# Patient Record
Sex: Female | Born: 1960 | Race: White | Hispanic: No | Marital: Married | Smoking: Never smoker
Health system: Southern US, Community
[De-identification: ages and names within clinical notes are randomized; demographics above are authoritative.]

## PROBLEM LIST (undated history)

## (undated) DIAGNOSIS — M1990 Unspecified osteoarthritis, unspecified site: Secondary | ICD-10-CM

## (undated) DIAGNOSIS — C801 Malignant (primary) neoplasm, unspecified: Secondary | ICD-10-CM

## (undated) DIAGNOSIS — R519 Headache, unspecified: Secondary | ICD-10-CM

## (undated) DIAGNOSIS — R42 Dizziness and giddiness: Secondary | ICD-10-CM

## (undated) HISTORY — PX: APPENDECTOMY: SHX54

## (undated) HISTORY — PX: KNEE ARTHROSCOPY: SUR90

---

## 2015-12-14 ENCOUNTER — Encounter (HOSPITAL_BASED_OUTPATIENT_CLINIC_OR_DEPARTMENT_OTHER): Payer: Self-pay | Admitting: *Deleted

## 2015-12-14 DIAGNOSIS — H81391 Other peripheral vertigo, right ear: Secondary | ICD-10-CM | POA: Insufficient documentation

## 2015-12-14 DIAGNOSIS — R42 Dizziness and giddiness: Secondary | ICD-10-CM | POA: Diagnosis present

## 2015-12-14 NOTE — ED Notes (Signed)
Pt reports right ear being stopped up today.  Reports that tonight while reading she got dizzy and the room began to spin.  Reports nausea.

## 2015-12-15 ENCOUNTER — Emergency Department (HOSPITAL_BASED_OUTPATIENT_CLINIC_OR_DEPARTMENT_OTHER)
Admission: EM | Admit: 2015-12-15 | Discharge: 2015-12-15 | Disposition: A | Discharge status: Home / Self Care | Payer: BLUE CROSS/BLUE SHIELD | Attending: Emergency Medicine | Admitting: Emergency Medicine

## 2015-12-15 DIAGNOSIS — H81391 Other peripheral vertigo, right ear: Secondary | ICD-10-CM

## 2015-12-15 MED ORDER — MECLIZINE HCL 25 MG PO TABS: 25.0000 mg | ORAL_TABLET | Freq: Three times a day (TID) | ORAL | Status: DC | PRN

## 2015-12-15 MED ORDER — MECLIZINE HCL 25 MG PO TABS
50.0000 mg | ORAL_TABLET | Freq: Once | ORAL | Status: AC
  Administered 2015-12-15: 50 mg via ORAL
  Filled 2015-12-15: qty 2

## 2015-12-15 MED ORDER — ONDANSETRON 4 MG PO TBDP
4.0000 mg | ORAL_TABLET | Freq: Once | ORAL | Status: AC
  Administered 2015-12-15: 4 mg via ORAL
  Filled 2015-12-15: qty 1

## 2015-12-15 NOTE — ED Provider Notes (Signed)
CSN: [id number]     Arrival date & time 12/14/15  2228 History   First MD Initiated Contact with Patient 12/15/15 0247     Chief Complaint  Patient presents with  . Dizziness     (Consider location/radiation/quality/duration/timing/severity/associated sxs/prior Treatment) HPI  This is a 55 year old female who experienced a feeling of fullness in her right ear yesterday throughout the day. She subsequently developed a roaring sensation in the right ear followed by a sensation of the room spinning. The symptoms were severe and exacerbated by movements of the head. It was somewhat improved with rest. The vertigo lasted about 2 hours and she is now comfortable. She is no longer having the roaring in her right ear. She did have nausea and one episode of vomiting earlier. She was given Zofran on arrival for her nausea.  History reviewed. No pertinent past medical history. Past Surgical History  Procedure Laterality Date  . Appendectomy    . Cesarean section     History reviewed. No pertinent family history. Social History  Substance Use Topics  . Smoking status: Never Smoker   . Smokeless tobacco: None  . Alcohol Use: No   OB History    No data available     Review of Systems  All other systems reviewed and are negative.   Allergies  Review of patient's allergies indicates no known allergies.  Home Medications   Prior to Admission medications   Not on File   BP 105/62 mmHg  Pulse 58  Temp(Src) 98.5 F (36.9 C) (Oral)  Resp 16  Ht 5\' 8"  (1.727 m)  Wt 135 lb (61.236 kg)  BMI 20.53 kg/m2  SpO2 99%   Physical Exam  General: Well-developed, well-nourished female in no acute distress; appearance consistent with age of record HENT: normocephalic; atraumatic; TMs normal Eyes: pupils equal, round and reactive to light; extraocular muscles intact; no nystagmus Neck: supple Heart: regular rate and rhythm Lungs: clear to auscultation bilaterally Abdomen: soft; nondistended;  nontender; bowel sounds present Extremities: No deformity; full range of motion; pulses normal Neurologic: Awake, alert and oriented; motor function intact in all extremities and symmetric; no facial droop; normal coordination and speech; negative Romberg; normal finger to nose Skin: Warm and dry Psychiatric: Normal mood and affect    ED Course  Procedures (including critical care time)   MDM     Shanon Rosser, MD 12/15/15 [id number]

## 2015-12-15 NOTE — ED Notes (Signed)
MD at bedside. 

## 2016-08-03 ENCOUNTER — Emergency Department (HOSPITAL_BASED_OUTPATIENT_CLINIC_OR_DEPARTMENT_OTHER)
Admission: EM | Admit: 2016-08-03 | Discharge: 2016-08-03 | Disposition: A | Discharge status: Home / Self Care | Payer: Worker's Compensation | Attending: Emergency Medicine | Admitting: Emergency Medicine

## 2016-08-03 ENCOUNTER — Emergency Department (HOSPITAL_BASED_OUTPATIENT_CLINIC_OR_DEPARTMENT_OTHER): Payer: Worker's Compensation

## 2016-08-03 ENCOUNTER — Encounter (HOSPITAL_BASED_OUTPATIENT_CLINIC_OR_DEPARTMENT_OTHER): Payer: Self-pay | Admitting: *Deleted

## 2016-08-03 DIAGNOSIS — Y9389 Activity, other specified: Secondary | ICD-10-CM | POA: Insufficient documentation

## 2016-08-03 DIAGNOSIS — S060X0A Concussion without loss of consciousness, initial encounter: Secondary | ICD-10-CM

## 2016-08-03 DIAGNOSIS — Z791 Long term (current) use of non-steroidal anti-inflammatories (NSAID): Secondary | ICD-10-CM | POA: Diagnosis not present

## 2016-08-03 DIAGNOSIS — R51 Headache: Secondary | ICD-10-CM

## 2016-08-03 DIAGNOSIS — W228XXA Striking against or struck by other objects, initial encounter: Secondary | ICD-10-CM | POA: Diagnosis not present

## 2016-08-03 DIAGNOSIS — Y990 Civilian activity done for income or pay: Secondary | ICD-10-CM | POA: Diagnosis not present

## 2016-08-03 DIAGNOSIS — R519 Headache, unspecified: Secondary | ICD-10-CM

## 2016-08-03 DIAGNOSIS — Y929 Unspecified place or not applicable: Secondary | ICD-10-CM | POA: Diagnosis not present

## 2016-08-03 DIAGNOSIS — S0990XA Unspecified injury of head, initial encounter: Secondary | ICD-10-CM | POA: Diagnosis present

## 2016-08-03 NOTE — ED Notes (Signed)
ED Provider at bedside. 

## 2016-08-03 NOTE — ED Notes (Signed)
Returned from CT.

## 2016-08-03 NOTE — ED Triage Notes (Signed)
Pt states head injury x 3 days , denies LOC, sent here from [facility] for ct scan and eval

## 2016-08-03 NOTE — ED Notes (Signed)
Patient transported to CT 

## 2016-08-03 NOTE — ED Provider Notes (Signed)
[facility] Provider Note   CSN: [id number] Arrival date & time: 08/03/16  1723  By signing my name below, I, Jenny Baker, attest that this documentation has been prepared under the direction and in the presence of Merrily Pew, MD. Electronically Signed: Hansel Baker, ED Scribe. 08/03/16. 9:36 PM.   History   Chief Complaint Chief Complaint  Patient presents with  . Headache    HPI Jenny Baker is a 56 y.o. female without chronic medical problems presents to the ED complaining of moderate, intermittent left parietal head pressure following a head injury 2 days ago. This patient states that she was at work when she stood and accidentally struck her L parietal area against the corner of a heavy metal object. She did not lose conciousness. She returned to work and applied ice. Pt states her pain worsens with ambulation and is accompanied by lightheadedness when more severe. She denies any other injuries. She denies any focal weaknesses, decreased sensation distally, speech problems, difficulty thinking, or changes in vision. She is tolerating food and water. She has no other concerns at this time.   The history is provided by the patient. No language interpreter was used.    History reviewed. No pertinent past medical history.  There are no active problems to display for this patient.   Past Surgical History:  Procedure Laterality Date  . APPENDECTOMY    . CESAREAN SECTION      OB History    No data available       Home Medications    Prior to Admission medications   Medication Sig Start Date End Date Taking? Authorizing Provider  ibuprofen (ADVIL,MOTRIN) 600 MG tablet Take 600 mg by mouth every 6 (six) hours as needed.   Yes Historical Provider, MD  meclizine (ANTIVERT) 25 MG tablet Take 1-2 tablets (25-50 mg total) by mouth 3 (three) times daily as needed (for vertigo). 12/15/15   Shanon Rosser, MD    Family History No family history on file.  Social  History Social History  Substance Use Topics  . Smoking status: Never Smoker  . Smokeless tobacco: Not on file  . Alcohol use No     Allergies   Patient has no known allergies.   Review of Systems Review of Systems  Eyes: Negative for visual disturbance.  Skin: Positive for wound.  Neurological: Positive for headaches. Negative for dizziness, speech difficulty, weakness and numbness.  All other systems reviewed and are negative.    Physical Exam Updated Vital Signs BP 109/57 (BP Location: Right Arm)   Pulse (!) 57   Resp 18   Ht 5\' 8"  (1.727 m)   Wt 145 lb (65.8 kg)   SpO2 98%   BMI 22.05 kg/m   Physical Exam  Constitutional: She is oriented to person, place, and time. She appears well-developed and well-nourished.  HENT:  Head: Normocephalic.  Eyes: Conjunctivae are normal.  Cardiovascular: Normal rate, regular rhythm and normal heart sounds.   Pulmonary/Chest: Effort normal and breath sounds normal. No respiratory distress. She has no wheezes. She has no rales.  Abdominal: She exhibits no distension.  Musculoskeletal: Normal range of motion.  Neurological: She is alert and oriented to person, place, and time. She has normal strength. No cranial nerve deficit or sensory deficit.  Normal patellar and bicep reflexes. Cranial nerves 2-12 grossly intact. Normal upper and lower extremity motor function and sensation.   Skin: Skin is warm and dry.  There is a small laceration to the L  parietal area which is currently hemostatic.   Psychiatric: She has a normal mood and affect. Her behavior is normal.  Nursing note and vitals reviewed.    ED Treatments / Results  DIAGNOSTIC STUDIES: Oxygen Saturation is 100% on RA, normal by my interpretation.    COORDINATION OF CARE: 9:20 PM Discussed treatment plan with pt at bedside and pt agreed to plan.    Labs (all labs ordered are listed, but only abnormal results are displayed) Labs Reviewed - No data to  display  EKG  EKG Interpretation None       Radiology Ct Head Wo Contrast  Result Date: 08/03/2016 CLINICAL DATA:  Hit head on light, with headache. Initial encounter. EXAM: CT HEAD WITHOUT CONTRAST TECHNIQUE: Contiguous axial images were obtained from the base of the skull through the vertex without intravenous contrast. COMPARISON:  None. FINDINGS: Brain: No evidence of acute infarction, hemorrhage, hydrocephalus, extra-axial collection or mass lesion/mass effect. The posterior fossa, including the cerebellum, brainstem and fourth ventricle, is within normal limits. The third and lateral ventricles, and basal ganglia are unremarkable in appearance. The cerebral hemispheres are symmetric in appearance, with normal gray-white differentiation. No mass effect or midline shift is seen. Vascular: No hyperdense vessel or unexpected calcification. Skull: There is no evidence of fracture; visualized osseous structures are unremarkable in appearance. Sinuses/Orbits: The orbits are within normal limits. The paranasal sinuses and mastoid air cells are well-aerated. Other: No significant soft tissue abnormalities are seen. IMPRESSION: No evidence of traumatic intracranial injury or fracture. Electronically Signed   By: Garald Balding M.D.   On: 08/03/2016 20:58    Procedures Procedures (including critical care time)  Medications Ordered in ED Medications - No data to display   Initial Impression / Assessment and Plan / ED Course  I have reviewed the triage vital signs and the nursing notes.  Pertinent labs & imaging results that were available during my care of the patient were reviewed by me and considered in my medical decision making (see chart for details).  Clinical Course     Patient with likely concussion since she had head injury and symptoms of headache, dizziness, decreased concentration. Head bleed or skull fracture is unlikely based on exam but 2/2 length of symptoms a CT done to eval  for bleed/fracture and was negative. Discussed with patient s/s to look out for that would require reevaluation in the emergency department (worsening headache, neurologic changes or new symptoms). Also discussed progressive return to activities and that symptoms could last months but were likely to be improved over 7-10 days of total symptoms.    Final Clinical Impressions(s) / ED Diagnoses   Final diagnoses:  Nonintractable headache, unspecified chronicity pattern, unspecified headache type  Concussion without loss of consciousness, initial encounter    New Prescriptions Discharge Medication List as of 08/03/2016  9:34 PM     I personally performed the services described in this documentation, which was scribed in my presence. The recorded information has been reviewed and is accurate.      Merrily Pew, MD 08/04/16 [phone]

## 2017-04-05 ENCOUNTER — Ambulatory Visit (INDEPENDENT_AMBULATORY_CARE_PROVIDER_SITE_OTHER): Payer: 59

## 2017-04-05 ENCOUNTER — Ambulatory Visit (INDEPENDENT_AMBULATORY_CARE_PROVIDER_SITE_OTHER): Payer: 59 | Admitting: Orthopaedic Surgery

## 2017-04-05 DIAGNOSIS — M25561 Pain in right knee: Secondary | ICD-10-CM

## 2017-04-05 MED ORDER — LIDOCAINE HCL 1 % IJ SOLN
3.0000 mL | INTRAMUSCULAR | Status: AC | PRN
  Administered 2017-04-05: 3 mL

## 2017-04-05 MED ORDER — METHYLPREDNISOLONE ACETATE 40 MG/ML IJ SUSP
40.0000 mg | INTRAMUSCULAR | Status: AC | PRN
  Administered 2017-04-05: 40 mg via INTRA_ARTICULAR

## 2017-04-05 NOTE — Progress Notes (Signed)
Office Visit Note   Patient: Jenny Baker           Date of Birth: 1960/08/06           MRN: [id number] Visit Date: 04/05/2017              Requested by: Martinique, Julie M, NP [address] [place], [place] [address] PCP: Martinique, Julie M, NP   Assessment & Plan: Visit Diagnoses:  1. Acute pain of right knee     Plan: Hopefully this is just synovitis in her knee as a relates to running. I talked about trying a steroid injection in her right knee and she is stable of this as well. We had a thorough discussion about the risk and benefits steroid injections as well. She tolerated the injection well. We'll see her back in about 2 weeks to see if this is helped. She'll continue quad strengthening exercises and can try an elliptical or excessive bike but should avoid running for 2 more weeks. This is the knee that I would definitely obtain an MRI of if she does not have her response.  Follow-Up Instructions: Return in about 2 weeks (around 04/19/2017).   Orders:  Orders Placed This Encounter  Procedures  . Large Joint Injection/Arthrocentesis  . XR Knee 1-2 Views Right   No orders of the defined types were placed in this encounter.     Procedures: Large Joint Inj Date/Time: 04/05/2017 8:52 AM Performed by: Mcarthur Rossetti Authorized by: Mcarthur Rossetti   Location:  Knee Site:  R knee Ultrasound Guidance: No   Fluoroscopic Guidance: No   Arthrogram: No   Medications:  3 mL lidocaine 1 %; 40 mg methylPREDNISolone acetate 40 MG/ML     Clinical Data: No additional findings.   Subjective: No chief complaint on file. The patient is very pleasant 56 year old from her who has not run in about 6 weeks now due to medial knee pain on the right knee. She does experience some swelling on the medial side as well. She said that if she has been sitting for a while and gets up she has stiffness in her knee and pain. She denies any locking catching in any  specific injury however she is a runner use about every other day and this really flared up after running on the beach about 6 weeks ago. She is otherwise a healthy 56 year old with no other active medical problems and no other problems as a relates to chief complaint of left knee pain. It's aching pain but is deathly tender she states to touching along the medial joint line.  HPI  Review of Systems She currently denies any headache, chest pain, short of breath, fever, chills, nausea, vomiting.  Objective: Vital Signs: There were no vitals taken for this visit.  Physical Exam She is alert or 3 and in no acute distress Ortho Exam Examination of her right knee shows no effusion today. She has a negative Lachman's and a negative McMurray's exam. She does have medial joint line tenderness and it seems to be over the medial femoral condyle. I cannot feel a plica but there is definitely tenderness over this area and the medial collateral ligament but the ligaments exam is stable. Her motion is full. She does have patellofemoral crepitation. Specialty Comments:  No specialty comments available.  Imaging: Xr Knee 1-2 Views Right  Result Date: 04/05/2017 2 views of the right knee show excellent alignment. The joint space is well-maintained. There is  no acute findings. There is minimal patellofemoral arthritic changes.    PMFS History: There are no active problems to display for this patient.  No past medical history on file.  No family history on file.  Past Surgical History:  Procedure Laterality Date  . APPENDECTOMY    . CESAREAN SECTION     Social History   Occupational History  . Not on file.   Social History Main Topics  . Smoking status: Never Smoker  . Smokeless tobacco: Not on file  . Alcohol use No  . Drug use: No  . Sexual activity: Not on file

## 2017-04-19 ENCOUNTER — Ambulatory Visit (INDEPENDENT_AMBULATORY_CARE_PROVIDER_SITE_OTHER): Payer: 59 | Admitting: Orthopaedic Surgery

## 2017-07-05 ENCOUNTER — Encounter (INDEPENDENT_AMBULATORY_CARE_PROVIDER_SITE_OTHER): Payer: Self-pay | Admitting: Orthopaedic Surgery

## 2017-07-05 ENCOUNTER — Ambulatory Visit (INDEPENDENT_AMBULATORY_CARE_PROVIDER_SITE_OTHER): Payer: 59 | Admitting: Orthopaedic Surgery

## 2017-07-05 DIAGNOSIS — G8929 Other chronic pain: Secondary | ICD-10-CM

## 2017-07-05 DIAGNOSIS — M25561 Pain in right knee: Secondary | ICD-10-CM | POA: Diagnosis not present

## 2017-07-05 MED ORDER — METHYLPREDNISOLONE ACETATE 40 MG/ML IJ SUSP
40.0000 mg | INTRAMUSCULAR | Status: AC | PRN
  Administered 2017-07-05: 40 mg via INTRA_ARTICULAR

## 2017-07-05 MED ORDER — LIDOCAINE HCL 1 % IJ SOLN
3.0000 mL | INTRAMUSCULAR | Status: AC | PRN
  Administered 2017-07-05: 3 mL

## 2017-07-05 NOTE — Progress Notes (Signed)
Office Visit Note   Patient: Jenny Baker           Date of Birth: 1961-04-22           MRN: [id number] Visit Date: 07/05/2017              Requested by: Martinique, Julie M, NP [address] [place], [place] [address] PCP: Martinique, Julie M, NP   Assessment & Plan: Visit Diagnoses:  1. Chronic pain of right knee     Plan: At this point I was agreeable to trying one more steroid injection since is been exactly 3 months since her last right knee steroid injection.  She understands the risk and benefits of this injection as well.  She tolerated the injection well.  Given the continued problems with her knee and the failure of all forms of conservative treatment including over 3 months of activity modification, anti-inflammatories, rest, quad strengthening exercises, anti-inflammatories and steroid injection now with a repeat steroid injection MRI is warranted to rule out a meniscal tear and to assess the cartilage in the medial compartment of her knee.  Follow-Up Instructions: Return if symptoms worsen or fail to improve.   Orders:  Orders Placed This Encounter  Procedures  . Large Joint Inj   No orders of the defined types were placed in this encounter.     Procedures: Large Joint Inj: R knee on 07/05/2017 3:36 PM Indications: diagnostic evaluation and pain Details: 22 G 1.5 in needle, superolateral approach  Arthrogram: No  Medications: 3 mL lidocaine 1 %; 40 mg methylPREDNISolone acetate 40 MG/ML Outcome: tolerated well, no immediate complications Procedure, treatment alternatives, risks and benefits explained, specific risks discussed. Consent was given by the patient. Immediately prior to procedure a time out was called to verify the correct patient, procedure, equipment, support staff and site/side marked as required. Patient was prepped and draped in the usual sterile fashion.       Clinical Data: No additional findings.   Subjective: Chief Complaint    Patient presents with  . Right Knee - Pain, Follow-up  The patient is well-known to me.  She has severe right knee pain.  She has had at least one steroid injection helped significantly about 4 weeks but she is getting significant medial joint line tenderness again with locking catching.  She is requesting another injection today.  The knee has been swelling on her as well.  She is an avid runner and does work on activity modification as well as knee strengthening exercises and taking anti-inflammatories.  HPI  Review of Systems She currently denies any other active medical problems.  Objective: Vital Signs: There were no vitals taken for this visit.  Physical Exam She is alert and oriented x3 and in no acute distress Ortho Exam Examination of her right knee shows a slight effusion comparing right and left knees.  She has significant medial joint line tenderness as well as a positive MRSA on the medial compartment.   Specialty Comments:  No specialty comments available.  Imaging: No results found. We reviewed x-rays previously of her right knee that shows a well-maintained joint space with no acute findings.  PMFS History: There are no active problems to display for this patient.  History reviewed. No pertinent past medical history.  History reviewed. No pertinent family history.  Past Surgical History:  Procedure Laterality Date  . APPENDECTOMY    . CESAREAN SECTION     Social History   Occupational History  .  Not on file  Tobacco Use  . Smoking status: Never Smoker  . Smokeless tobacco: Never Used  Substance and Sexual Activity  . Alcohol use: No  . Drug use: No  . Sexual activity: Not on file

## 2017-08-15 ENCOUNTER — Other Ambulatory Visit (INDEPENDENT_AMBULATORY_CARE_PROVIDER_SITE_OTHER): Payer: Self-pay

## 2017-08-15 ENCOUNTER — Telehealth (INDEPENDENT_AMBULATORY_CARE_PROVIDER_SITE_OTHER): Payer: Self-pay | Admitting: Orthopaedic Surgery

## 2017-08-15 DIAGNOSIS — G8929 Other chronic pain: Secondary | ICD-10-CM

## 2017-08-15 DIAGNOSIS — M25561 Pain in right knee: Principal | ICD-10-CM

## 2017-08-15 NOTE — Telephone Encounter (Signed)
Patient called stated Dr. Ninfa Linden told her to call in when she thought she needed MRI. She said he would send in the order for that to be scheduled.  Please call patient to be advised

## 2017-08-15 NOTE — Telephone Encounter (Signed)
Order sent to schedule MRI

## 2017-08-25 ENCOUNTER — Ambulatory Visit
Admission: RE | Admit: 2017-08-25 | Discharge: 2017-08-25 | Disposition: A | Discharge status: Home / Self Care | Payer: Managed Care, Other (non HMO) | Source: Ambulatory Visit | Attending: Orthopaedic Surgery | Admitting: Orthopaedic Surgery

## 2017-08-25 DIAGNOSIS — M25561 Pain in right knee: Principal | ICD-10-CM

## 2017-08-25 DIAGNOSIS — G8929 Other chronic pain: Secondary | ICD-10-CM

## 2017-09-06 ENCOUNTER — Ambulatory Visit (INDEPENDENT_AMBULATORY_CARE_PROVIDER_SITE_OTHER): Payer: Managed Care, Other (non HMO) | Admitting: Orthopaedic Surgery

## 2017-09-06 ENCOUNTER — Encounter (INDEPENDENT_AMBULATORY_CARE_PROVIDER_SITE_OTHER): Payer: Self-pay | Admitting: Orthopaedic Surgery

## 2017-09-06 ENCOUNTER — Telehealth (INDEPENDENT_AMBULATORY_CARE_PROVIDER_SITE_OTHER): Payer: Self-pay

## 2017-09-06 DIAGNOSIS — M25561 Pain in right knee: Secondary | ICD-10-CM

## 2017-09-06 DIAGNOSIS — G8929 Other chronic pain: Secondary | ICD-10-CM | POA: Diagnosis not present

## 2017-09-06 NOTE — Telephone Encounter (Signed)
Submitted application for Monovisc injection for right knee online.

## 2017-09-06 NOTE — Progress Notes (Signed)
Patient is following up with an MRI of her right knee.  She has been having some pain in that knee with activities that were weightbearing and she is been sitting for a while.  It is all the medial joint line of her knee as a source of her pain.  Has had 2 steroid injection in the knee as well as work on activity modification and anti-inflammatories.  Her husband is a Physiological scientist.  We talked about quad strengthening exercises as well.  On exam she still hurts consistently along the medial joint line of her right knee.  The knee is ligamentously stable otherwise.  MRI of her right knee is obtained and does show inflammation of Hoffa's fat pad distal left side of her knee.  There is some slight signal changes in the posterior horn the meniscus but no gross tear.  She does have partial-thickness cartilage loss on the medial compartment of the knee.  The remainder of her knee appears normal.  This point we will try hyaluronic acid and we will order this for her.  I explained the rationale risk and benefits of this as well to her.  She is agreeable to trying this as well as continue quad strengthening exercises.  We see her back in 2 weeks hopefully will be placing a hyaluronic acid injection in her right knee.

## 2017-09-12 ENCOUNTER — Telehealth (INDEPENDENT_AMBULATORY_CARE_PROVIDER_SITE_OTHER): Payer: Self-pay

## 2017-09-12 NOTE — Telephone Encounter (Signed)
Faxed completed PA form to [facility] at [phone].

## 2017-09-20 ENCOUNTER — Encounter (INDEPENDENT_AMBULATORY_CARE_PROVIDER_SITE_OTHER): Payer: Self-pay | Admitting: Orthopaedic Surgery

## 2017-09-20 ENCOUNTER — Ambulatory Visit (INDEPENDENT_AMBULATORY_CARE_PROVIDER_SITE_OTHER): Payer: Managed Care, Other (non HMO) | Admitting: Orthopaedic Surgery

## 2017-09-20 DIAGNOSIS — M1711 Unilateral primary osteoarthritis, right knee: Secondary | ICD-10-CM

## 2017-09-20 MED ORDER — HYALURONAN 88 MG/4ML IX SOSY
88.0000 mg | PREFILLED_SYRINGE | INTRA_ARTICULAR | Status: AC | PRN
  Administered 2017-09-20: 88 mg via INTRA_ARTICULAR

## 2017-09-20 NOTE — Progress Notes (Signed)
   Procedure Note  Patient: Jenny Baker             Date of Birth: 12/20/1960           MRN: [id number]             Visit Date: 09/20/2017  Procedures: Visit Diagnoses: Unilateral primary osteoarthritis, right knee  Large Joint Inj: R knee on 09/20/2017 9:44 AM Indications: pain and diagnostic evaluation Details: 22 G 1.5 in needle, superolateral approach  Arthrogram: No  Medications: 88 mg Hyaluronan 88 MG/4ML Outcome: tolerated well, no immediate complications Procedure, treatment alternatives, risks and benefits explained, specific risks discussed. Consent was given by the patient. Immediately prior to procedure a time out was called to verify the correct patient, procedure, equipment, support staff and site/side marked as required. Patient was prepped and draped in the usual sterile fashion.    The patient is here today for scheduled Monovisc injection of hyaluronic acid into her right knee.  This is to treat mild to moderate arthritis is been diagnosed with clinical exam and MRI.  She has no full-thickness cartilage defects in the knee.  There is moderate thinning of the medial compartment of her knee.  She understands fully the risk benefits injections and how this can help with mild to moderate osteoarthritis and knee pain.  She tolerated the injection well in the right knee.  All questions and concerns were answered and addressed.  She understands she can always get a steroid injection in 3 months from now if needed.

## 2017-12-05 ENCOUNTER — Encounter (INDEPENDENT_AMBULATORY_CARE_PROVIDER_SITE_OTHER): Payer: Self-pay | Admitting: Orthopaedic Surgery

## 2017-12-05 ENCOUNTER — Ambulatory Visit (INDEPENDENT_AMBULATORY_CARE_PROVIDER_SITE_OTHER): Payer: Managed Care, Other (non HMO) | Admitting: Orthopaedic Surgery

## 2017-12-05 DIAGNOSIS — M25561 Pain in right knee: Secondary | ICD-10-CM | POA: Diagnosis not present

## 2017-12-05 DIAGNOSIS — G8929 Other chronic pain: Secondary | ICD-10-CM

## 2017-12-05 MED ORDER — METHYLPREDNISOLONE ACETATE 40 MG/ML IJ SUSP
40.0000 mg | INTRAMUSCULAR | Status: AC | PRN
  Administered 2017-12-05: 40 mg via INTRA_ARTICULAR

## 2017-12-05 MED ORDER — LIDOCAINE HCL 1 % IJ SOLN
3.0000 mL | INTRAMUSCULAR | Status: AC | PRN
  Administered 2017-12-05: 3 mL

## 2017-12-05 NOTE — Progress Notes (Signed)
Office Visit Note   Patient: Jenny Baker           Date of Birth: 03-Sep-1960           MRN: [id number] Visit Date: 12/05/2017              Requested by: Martinique, Julie M, NP [address] [place], [place] [address] PCP: Martinique, Julie M, NP   Assessment & Plan: Visit Diagnoses:  1. Chronic pain of right knee     Plan: I do feel that she should try at least one more steroid injections in her right knee to calm down the acute pain she is experiencing.  Certainly the next intervention may be an arthroscopic intervention to further assess the cause in her knee given the amount of pain that she is having.  She is already an avid exerciser and has strong quad muscles and is a thin individual.  Again she is Artie tried hyaluronic acid as well.  We will reevaluate her in 2 weeks.  Follow-Up Instructions: Return in about 2 weeks (around 12/19/2017).   Orders:  Orders Placed This Encounter  Procedures  . Large Joint Inj: R knee   No orders of the defined types were placed in this encounter.     Procedures: Large Joint Inj: R knee on 12/05/2017 8:38 AM Indications: diagnostic evaluation and pain Details: 22 G 1.5 in needle, superolateral approach  Arthrogram: No  Medications: 3 mL lidocaine 1 %; 40 mg methylPREDNISolone acetate 40 MG/ML Outcome: tolerated well, no immediate complications Procedure, treatment alternatives, risks and benefits explained, specific risks discussed. Consent was given by the patient. Immediately prior to procedure a time out was called to verify the correct patient, procedure, equipment, support staff and site/side marked as required. Patient was prepped and draped in the usual sterile fashion.       Clinical Data: No additional findings.   Subjective: Chief Complaint  Patient presents with  . Right Knee - Pain  The patient comes in today with continued right knee pain.  She is had a steroid injection and hyaluronic acid.  Is mainly  medial joint line of hurts and is waking up at night making quite good.  MRI in February of this year did show partial-thickness cartilage loss in the medial compartment of her knee with no meniscal tear.  She still denies any locking catching is more of a mechanical type of pain with weightbearing and increasing her activities.  She says driving is been worse for her to terms of pain on the medial aspect of her knee where she points to.  HPI  Review of Systems Denies fever and chills  Objective: Vital Signs: There were no vitals taken for this visit.  Physical Exam AOX3 Ortho Exam Right knee with medial joint line tenderness. Varus malalignment. No effusion; ligaments stable, Neg [place] Specialty Comments:  No specialty comments available.  Imaging: No results found. The previous MRI of her right knee was reviewed and the main problem is cartilage thinning in the medial compartment of her knee.  There is no meniscal tear.  PMFS History: Patient Active Problem List   Diagnosis Date Noted  . Chronic pain of right knee 09/06/2017   History reviewed. No pertinent past medical history.  History reviewed. No pertinent family history.  Past Surgical History:  Procedure Laterality Date  . APPENDECTOMY    . CESAREAN SECTION     Social History   Occupational History  . Not on  file  Tobacco Use  . Smoking status: Never Smoker  . Smokeless tobacco: Never Used  Substance and Sexual Activity  . Alcohol use: No  . Drug use: No  . Sexual activity: Not on file

## 2017-12-26 ENCOUNTER — Encounter (INDEPENDENT_AMBULATORY_CARE_PROVIDER_SITE_OTHER): Payer: Self-pay | Admitting: Orthopaedic Surgery

## 2017-12-26 ENCOUNTER — Ambulatory Visit (INDEPENDENT_AMBULATORY_CARE_PROVIDER_SITE_OTHER): Payer: Managed Care, Other (non HMO) | Admitting: Orthopaedic Surgery

## 2017-12-26 DIAGNOSIS — M659 Synovitis and tenosynovitis, unspecified: Secondary | ICD-10-CM | POA: Diagnosis not present

## 2017-12-26 DIAGNOSIS — M25561 Pain in right knee: Secondary | ICD-10-CM | POA: Diagnosis not present

## 2017-12-26 DIAGNOSIS — M94261 Chondromalacia, right knee: Secondary | ICD-10-CM | POA: Diagnosis not present

## 2017-12-26 DIAGNOSIS — G8929 Other chronic pain: Secondary | ICD-10-CM | POA: Diagnosis not present
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
## 2018-01-11 ENCOUNTER — Encounter: Payer: Self-pay | Admitting: Orthopaedic Surgery

## 2018-01-11 DIAGNOSIS — M94261 Chondromalacia, right knee: Secondary | ICD-10-CM | POA: Diagnosis not present

## 2018-01-11 DIAGNOSIS — M23321 Other meniscus derangements, posterior horn of medial meniscus, right knee: Secondary | ICD-10-CM | POA: Diagnosis not present

## 2018-01-18 ENCOUNTER — Ambulatory Visit (INDEPENDENT_AMBULATORY_CARE_PROVIDER_SITE_OTHER): Payer: Managed Care, Other (non HMO) | Admitting: Orthopaedic Surgery

## 2018-01-18 ENCOUNTER — Encounter (INDEPENDENT_AMBULATORY_CARE_PROVIDER_SITE_OTHER): Payer: Self-pay | Admitting: Orthopaedic Surgery

## 2018-01-18 ENCOUNTER — Other Ambulatory Visit (INDEPENDENT_AMBULATORY_CARE_PROVIDER_SITE_OTHER): Payer: Self-pay

## 2018-01-18 ENCOUNTER — Telehealth (INDEPENDENT_AMBULATORY_CARE_PROVIDER_SITE_OTHER): Payer: Self-pay

## 2018-01-18 DIAGNOSIS — M1711 Unilateral primary osteoarthritis, right knee: Secondary | ICD-10-CM

## 2018-01-18 DIAGNOSIS — Z9889 Other specified postprocedural states: Secondary | ICD-10-CM

## 2018-01-18 NOTE — Telephone Encounter (Signed)
Noted  

## 2018-01-18 NOTE — Telephone Encounter (Signed)
Right Knee gel injection

## 2018-01-18 NOTE — Progress Notes (Signed)
The patient is here in follow-up after having a right knee arthroscopy with a partial medial meniscectomy.  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  She comes in with moderate knee effusion today and pain with knee flexion.  On exam she does have a moderate effusion and I aspirated 35 cc of fluid from the knee and place a steroid injection in the knee.  This gave her significant relief.  Her incisions of good arm of the sutures.  We went over the arthroscopy pictures in detail and showed her the extent of the cartilage wear in the medial femoral condyle.  We had a long thorough discussion about avoiding high impact aerobic activities.  She can start an exercise bike to work on range of motion and bending her knee.  We will see her back in 4 weeks because at that point will be the perfect time to try hyaluronic acid injection for her right knee giving the moderate arthritis of her seeing.  I believe that that will help her significantly.  All question concerns were answered and addressed.

## 2018-01-23 ENCOUNTER — Telehealth (INDEPENDENT_AMBULATORY_CARE_PROVIDER_SITE_OTHER): Payer: Self-pay

## 2018-01-23 NOTE — Telephone Encounter (Signed)
Submitted application online for Monovisc, right knee. 

## 2018-02-01 ENCOUNTER — Telehealth (INDEPENDENT_AMBULATORY_CARE_PROVIDER_SITE_OTHER): Payer: Self-pay

## 2018-02-01 NOTE — Telephone Encounter (Signed)
PA initiated with [employer].  PA form being faxed, today 02/01/2018.

## 2018-02-12 ENCOUNTER — Telehealth (INDEPENDENT_AMBULATORY_CARE_PROVIDER_SITE_OTHER): Payer: Self-pay

## 2018-02-12 NOTE — Telephone Encounter (Signed)
PA required for Monovisc, right knee.  Faxed completed PA form to [phone].  Patient will not be able to receive another Monovisc injection until after 03/20/2018.  Patient has to wait 6 months before receiving another gel injection.

## 2018-02-15 ENCOUNTER — Encounter (INDEPENDENT_AMBULATORY_CARE_PROVIDER_SITE_OTHER): Payer: Self-pay | Admitting: Orthopaedic Surgery

## 2018-02-15 ENCOUNTER — Ambulatory Visit (INDEPENDENT_AMBULATORY_CARE_PROVIDER_SITE_OTHER): Payer: Managed Care, Other (non HMO) | Admitting: Orthopaedic Surgery

## 2018-02-15 DIAGNOSIS — Z9889 Other specified postprocedural states: Secondary | ICD-10-CM
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
## 2018-02-26 ENCOUNTER — Telehealth (INDEPENDENT_AMBULATORY_CARE_PROVIDER_SITE_OTHER): Payer: Self-pay

## 2018-02-26 NOTE — Telephone Encounter (Signed)
Talked with patient and advised her that she is approved for Monovisc, Right Knee. [place] $30.00 Patient will be responsible for 20% OOP. PA Approval# [id number] Valid 02/12/2018 - 05/13/2018 Appt.scheduled 03/21/2018.

## 2018-03-19 ENCOUNTER — Ambulatory Visit (INDEPENDENT_AMBULATORY_CARE_PROVIDER_SITE_OTHER): Payer: Managed Care, Other (non HMO) | Admitting: Orthopaedic Surgery

## 2018-03-20 ENCOUNTER — Emergency Department (HOSPITAL_BASED_OUTPATIENT_CLINIC_OR_DEPARTMENT_OTHER)
Admission: EM | Admit: 2018-03-20 | Discharge: 2018-03-20 | Disposition: A | Discharge status: Home / Self Care | Payer: Managed Care, Other (non HMO) | Attending: Emergency Medicine | Admitting: Emergency Medicine

## 2018-03-20 ENCOUNTER — Other Ambulatory Visit: Payer: Self-pay

## 2018-03-20 ENCOUNTER — Emergency Department (HOSPITAL_BASED_OUTPATIENT_CLINIC_OR_DEPARTMENT_OTHER): Payer: Managed Care, Other (non HMO)

## 2018-03-20 ENCOUNTER — Encounter (HOSPITAL_BASED_OUTPATIENT_CLINIC_OR_DEPARTMENT_OTHER): Payer: Self-pay | Admitting: *Deleted

## 2018-03-20 DIAGNOSIS — R0789 Other chest pain: Secondary | ICD-10-CM

## 2018-03-20 DIAGNOSIS — R072 Precordial pain: Secondary | ICD-10-CM | POA: Insufficient documentation

## 2018-03-20 DIAGNOSIS — Y92410 Unspecified street and highway as the place of occurrence of the external cause: Secondary | ICD-10-CM | POA: Insufficient documentation

## 2018-03-20 DIAGNOSIS — Y9389 Activity, other specified: Secondary | ICD-10-CM | POA: Diagnosis not present

## 2018-03-20 DIAGNOSIS — M25561 Pain in right knee: Secondary | ICD-10-CM | POA: Diagnosis not present
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
## 2018-03-20 NOTE — Discharge Instructions (Signed)
Follow-up with your orthopedist as scheduled tomorrow.

## 2018-03-21 ENCOUNTER — Encounter (INDEPENDENT_AMBULATORY_CARE_PROVIDER_SITE_OTHER): Payer: Self-pay | Admitting: Orthopaedic Surgery

## 2018-03-21 ENCOUNTER — Ambulatory Visit (INDEPENDENT_AMBULATORY_CARE_PROVIDER_SITE_OTHER): Payer: Managed Care, Other (non HMO) | Admitting: Orthopaedic Surgery

## 2018-03-21 DIAGNOSIS — M25561 Pain in right knee: Secondary | ICD-10-CM

## 2018-03-21 DIAGNOSIS — G8929 Other chronic pain: Secondary | ICD-10-CM
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
## 2018-03-27 ENCOUNTER — Ambulatory Visit (INDEPENDENT_AMBULATORY_CARE_PROVIDER_SITE_OTHER): Payer: Managed Care, Other (non HMO) | Admitting: Orthopaedic Surgery

## 2018-03-27 ENCOUNTER — Encounter (INDEPENDENT_AMBULATORY_CARE_PROVIDER_SITE_OTHER): Payer: Self-pay | Admitting: Orthopaedic Surgery

## 2018-03-27 DIAGNOSIS — Z9889 Other specified postprocedural states: Secondary | ICD-10-CM

## 2018-03-27 DIAGNOSIS — M1711 Unilateral primary osteoarthritis, right knee: Secondary | ICD-10-CM | POA: Diagnosis not present

## 2018-03-27 DIAGNOSIS — M25561 Pain in right knee: Secondary | ICD-10-CM

## 2018-03-27 DIAGNOSIS — G8929 Other chronic pain: Secondary | ICD-10-CM

## 2018-03-27 MED ORDER — LIDOCAINE HCL 1 % IJ SOLN
3.0000 mL | INTRAMUSCULAR | Status: AC | PRN
  Administered 2018-03-27: 3 mL

## 2018-03-27 MED ORDER — HYALURONAN 88 MG/4ML IX SOSY
88.0000 mg | PREFILLED_SYRINGE | INTRA_ARTICULAR | Status: AC | PRN
Start: 2018-03-27 — End: 2018-03-27
  Administered 2018-03-27: 88 mg via INTRA_ARTICULAR
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
## 2018-03-30 ENCOUNTER — Telehealth (INDEPENDENT_AMBULATORY_CARE_PROVIDER_SITE_OTHER): Payer: Self-pay | Admitting: Orthopaedic Surgery

## 2018-03-30 MED ORDER — METHOCARBAMOL 500 MG PO TABS: 500.0000 mg | ORAL_TABLET | Freq: Four times a day (QID) | ORAL | 0 refills | Status: DC | PRN

## 2018-03-30 NOTE — Telephone Encounter (Signed)
Patient called advised she is still having spasms in her back. Patient asked if Dr Ninfa Linden would prescribe a muscle relaxer for her. Patient asked for something that's not to strong. The number to contact patient is [phone]

## 2018-03-30 NOTE — Telephone Encounter (Signed)
I called patient and advised. 

## 2018-03-30 NOTE — Telephone Encounter (Signed)
Please advise 

## 2018-03-30 NOTE — Telephone Encounter (Signed)
I sent some Robaxin in to [facility].

## 2018-04-13 ENCOUNTER — Telehealth (INDEPENDENT_AMBULATORY_CARE_PROVIDER_SITE_OTHER): Payer: Self-pay | Admitting: Orthopaedic Surgery

## 2018-04-13 NOTE — Telephone Encounter (Signed)
If she wants or her lawyer wants a MRI of her right knee then that will be fine.  Will be difficult to interpret since she just had surgery on that knee.

## 2018-04-13 NOTE — Telephone Encounter (Signed)
Patient would like to know if Dr.Blackman could call in a MRI order per patient's lawyer. Ms.Herin will come in for an  Appointment if needed.

## 2018-04-13 NOTE — Telephone Encounter (Signed)
Please advise 

## 2018-04-16 ENCOUNTER — Other Ambulatory Visit (INDEPENDENT_AMBULATORY_CARE_PROVIDER_SITE_OTHER): Payer: Self-pay

## 2018-04-16 DIAGNOSIS — M549 Dorsalgia, unspecified: Secondary | ICD-10-CM

## 2018-04-16 NOTE — Telephone Encounter (Signed)
That will be fine. 

## 2018-04-16 NOTE — Telephone Encounter (Signed)
Called patient to advise on message below. She states she understand what Dr Ninfa Linden said but she would like to at least have MRI upper back since she is still having pain. She states her knee is doing better now. Okay to put in MRI T spine?    CB: [phone]

## 2018-04-16 NOTE — Telephone Encounter (Signed)
Order made.  This is just an Pharmacist, hospital for you [place].

## 2018-04-19 ENCOUNTER — Other Ambulatory Visit (INDEPENDENT_AMBULATORY_CARE_PROVIDER_SITE_OTHER): Payer: Self-pay | Admitting: Orthopaedic Surgery

## 2018-04-23 ENCOUNTER — Ambulatory Visit
Admission: RE | Admit: 2018-04-23 | Discharge: 2018-04-23 | Disposition: A | Discharge status: Home / Self Care | Payer: Managed Care, Other (non HMO) | Source: Ambulatory Visit | Attending: Orthopaedic Surgery | Admitting: Orthopaedic Surgery

## 2018-04-23 DIAGNOSIS — M549 Dorsalgia, unspecified: Secondary | ICD-10-CM

## 2018-04-30 ENCOUNTER — Ambulatory Visit (INDEPENDENT_AMBULATORY_CARE_PROVIDER_SITE_OTHER): Payer: Managed Care, Other (non HMO) | Admitting: Orthopaedic Surgery

## 2018-05-02 ENCOUNTER — Ambulatory Visit (INDEPENDENT_AMBULATORY_CARE_PROVIDER_SITE_OTHER): Payer: Managed Care, Other (non HMO) | Admitting: Orthopaedic Surgery

## 2018-05-07 ENCOUNTER — Ambulatory Visit (INDEPENDENT_AMBULATORY_CARE_PROVIDER_SITE_OTHER): Payer: Managed Care, Other (non HMO) | Admitting: Orthopaedic Surgery

## 2018-05-07 ENCOUNTER — Encounter (INDEPENDENT_AMBULATORY_CARE_PROVIDER_SITE_OTHER): Payer: Self-pay | Admitting: Orthopaedic Surgery

## 2018-05-07 DIAGNOSIS — M549 Dorsalgia, unspecified: Secondary | ICD-10-CM | POA: Diagnosis not present
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
## 2018-06-04 ENCOUNTER — Ambulatory Visit (INDEPENDENT_AMBULATORY_CARE_PROVIDER_SITE_OTHER): Payer: Managed Care, Other (non HMO) | Admitting: Orthopaedic Surgery

## 2018-06-04 ENCOUNTER — Encounter (INDEPENDENT_AMBULATORY_CARE_PROVIDER_SITE_OTHER): Payer: Self-pay | Admitting: Orthopaedic Surgery

## 2018-06-04 DIAGNOSIS — G8929 Other chronic pain: Secondary | ICD-10-CM

## 2018-06-04 DIAGNOSIS — M549 Dorsalgia, unspecified: Secondary | ICD-10-CM

## 2018-06-04 DIAGNOSIS — M25561 Pain in right knee: Secondary | ICD-10-CM | POA: Diagnosis not present

## 2018-06-04 DIAGNOSIS — Z9889 Other specified postprocedural states: Secondary | ICD-10-CM | POA: Diagnosis not present
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
## 2018-12-21 ENCOUNTER — Ambulatory Visit (INDEPENDENT_AMBULATORY_CARE_PROVIDER_SITE_OTHER): Payer: 59 | Admitting: Plastic Surgery

## 2018-12-21 ENCOUNTER — Encounter: Payer: Self-pay | Admitting: Plastic Surgery

## 2018-12-21 ENCOUNTER — Other Ambulatory Visit: Payer: Self-pay

## 2018-12-21 VITALS — BP 116/69 | HR 75 | Temp 99.1°F | Ht 68.0 in | Wt 162.0 lb

## 2018-12-21 DIAGNOSIS — Z85828 Personal history of other malignant neoplasm of skin: Secondary | ICD-10-CM | POA: Diagnosis not present

## 2018-12-21 DIAGNOSIS — L989 Disorder of the skin and subcutaneous tissue, unspecified: Secondary | ICD-10-CM | POA: Diagnosis not present

## 2018-12-21 NOTE — Progress Notes (Signed)
     Patient ID: Jenny Baker, female    DOB: [birth year], 58 y.o.   MRN: [id number]   Chief Complaint  Patient presents with  . Skin Problem    The patient is a 58 yrs old wf here for evaluation of her face.  She has a history of skin cancer on her left cheek.  She has noticed a lesion on the nasolabial area of the left cheek.  It has changed significantly over the past year.  It has gotten larger and darker.  It is raised and a irregular.  It is mostly flesh colored but has a small area of hyperpigmentation. It is ~ 5 mm in size.  There is a red scaly area on the left cheek lateral to this one.  It is getting larger as well.  It is 7 mm in size. Nothing makes either better.  They seem to be getting worse with time.   Review of Systems  Constitutional: Negative.  Negative for activity change.  HENT: Negative.   Eyes: Negative.   Respiratory: Negative.  Negative for shortness of breath.   Cardiovascular: Negative.   Gastrointestinal: Negative.   Genitourinary: Negative.   Musculoskeletal: Negative.   Skin: Positive for color change.  Psychiatric/Behavioral: Negative.     History reviewed. No pertinent past medical history.  Past Surgical History:  Procedure Laterality Date  . APPENDECTOMY    . CESAREAN SECTION    . KNEE ARTHROSCOPY        Current Outpatient Medications:  .  ibuprofen (ADVIL,MOTRIN) 600 MG tablet, Take 600 mg by mouth every 6 (six) hours as needed., Disp: , Rfl:  .  meclizine (ANTIVERT) 25 MG tablet, Take 1-2 tablets (25-50 mg total) by mouth 3 (three) times daily as needed (for vertigo)., Disp: 30 tablet, Rfl: 0   Objective:   Vitals:   12/21/18 1416  BP: 116/69  Pulse: 75  Temp: 99.1 F (37.3 C)  SpO2: 97%    Physical Exam Nursing note reviewed.  Constitutional:      Appearance: Normal appearance.  HENT:     Head: Normocephalic and atraumatic.   Cardiovascular:     Pulses: Normal pulses.  Pulmonary:     Effort: Pulmonary effort is normal.   Abdominal:     General: Abdomen is flat.  Neurological:     Mental Status: She is alert and oriented to person, place, and time.  Psychiatric:        Mood and Affect: Mood normal.        Behavior: Behavior normal.     Assessment & Plan:  History of basal cell carcinoma (BCC)  Changing skin lesion Recommend excision of changing skin lesions of cheek.  Pictures were obtained of the patient and placed in the chart with the patient's or guardian's permission.   [place], DO

## 2019-01-31 ENCOUNTER — Telehealth: Payer: Self-pay | Admitting: Plastic Surgery

## 2019-01-31 NOTE — Telephone Encounter (Signed)

## 2019-02-01 ENCOUNTER — Ambulatory Visit (INDEPENDENT_AMBULATORY_CARE_PROVIDER_SITE_OTHER): Payer: 59 | Admitting: Plastic Surgery

## 2019-02-01 ENCOUNTER — Other Ambulatory Visit (HOSPITAL_COMMUNITY)
Admission: RE | Admit: 2019-02-01 | Discharge: 2019-02-01 | Disposition: A | Discharge status: Home / Self Care | Payer: 59 | Source: Ambulatory Visit | Attending: Plastic Surgery | Admitting: Plastic Surgery

## 2019-02-01 ENCOUNTER — Ambulatory Visit: Payer: 59 | Admitting: Plastic Surgery

## 2019-02-01 ENCOUNTER — Encounter: Payer: Self-pay | Admitting: Plastic Surgery

## 2019-02-01 ENCOUNTER — Other Ambulatory Visit: Payer: Self-pay

## 2019-02-01 VITALS — BP 113/67 | HR 78 | Temp 98.8°F | Ht 68.0 in | Wt 161.0 lb

## 2019-02-01 DIAGNOSIS — L989 Disorder of the skin and subcutaneous tissue, unspecified: Secondary | ICD-10-CM | POA: Diagnosis not present

## 2019-02-01 NOTE — Progress Notes (Signed)
Preoperative Dx: Changing skin lesions of left cheek  Postoperative Dx: Same  Procedure: 1. Excision of changing skin lesion of left cheek 0.6 cm 2. Biopsy of left lateral cheek 3 mm  Surgeon: Dr. Lyndee Leo Treshaun Carrico  Anesthesia: Lidocaine 1% with 1:100,000 epinepherine  Indication for Procedure: skin lesion  Description of Procedure: Risks and complications were explained to the patient.  Consent was confirmed and signed.  Time out was called and all information was confirmed to be correct.  The area was prepped with betadine and drapped.  Lidocaine 1% with epinepherine was injected in the subcutaneous area.  After waiting several minutes for the lidocaine to take affect a #15 blade was used to excise the 0.6 cm area in an eliptical pattern.  A 6-0 Monocryl was used to close the skin edges.   A punch biopsy of the left lateral cheek with a 3 mm punch biopsy.  A 6-0 Monocryl was used to close the skin edges.  Steri strips were applied.  The patient is to follow up in one week.  She tolerated the procedure well and there were no complications. The specimen was marked long stitch lateral and short superior then sent to pathology with the biopsy report from the referring physician.

## 2019-02-07 ENCOUNTER — Telehealth: Payer: Self-pay | Admitting: Plastic Surgery

## 2019-02-07 NOTE — Telephone Encounter (Signed)

## 2019-02-08 ENCOUNTER — Other Ambulatory Visit: Payer: Self-pay

## 2019-02-08 ENCOUNTER — Encounter: Payer: Self-pay | Admitting: Surgical

## 2019-02-08 ENCOUNTER — Ambulatory Visit (INDEPENDENT_AMBULATORY_CARE_PROVIDER_SITE_OTHER): Payer: 59 | Admitting: Surgical

## 2019-02-08 VITALS — BP 106/69 | HR 75 | Temp 98.7°F

## 2019-02-08 DIAGNOSIS — L989 Disorder of the skin and subcutaneous tissue, unspecified: Secondary | ICD-10-CM

## 2019-02-08 DIAGNOSIS — C44319 Basal cell carcinoma of skin of other parts of face: Secondary | ICD-10-CM

## 2019-02-08 NOTE — Progress Notes (Signed)
   Subjective:     Patient ID: Jenny Baker, female    DOB: 10-05-1960, 58 y.o.   MRN: [id number]  No chief complaint on file.   HPI: The patient is a 58 y.o. female here for follow-up after excision of two facial skin lesions on 02/01/19. 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 The lesion on her left cheek, near  the nasolabial fold is nodular basal cell carcinoma. The more lateral lesion a lichenoid actinic keratosis.  Incisions are healing well. No sign of infection.   Review of Systems  Constitutional: Negative for chills, diaphoresis and fever.  Eyes: Negative.   Respiratory: Negative for cough, shortness of breath and wheezing.   Cardiovascular: Negative for chest pain.  Skin: Negative.  Negative for itching and rash.  Neurological: Negative.    Objective:   Vital Signs BP 106/69 (BP Location: Right Arm, Patient Position: Sitting, Cuff Size: Normal)   Pulse 75   Temp 98.7 F (37.1 C) (Temporal)   SpO2 99%  Vital Signs and Nursing Note Reviewed  Physical Exam  Constitutional: She is oriented to person, place, and time and well-developed, well-nourished, and in no distress. No distress.  HENT:  Head: Normocephalic and atraumatic.    Incisions on left cheek healing well.   Neck: Normal range of motion.  Cardiovascular: Normal rate.  Pulmonary/Chest: Effort normal.  Musculoskeletal: Normal range of motion.  Neurological: She is alert and oriented to person, place, and time. Gait normal.  Skin: Skin is warm and dry. No rash noted. She is not diaphoretic. No erythema. No pallor.    Assessment/Plan:     ICD-10-CM   1. Basal cell carcinoma of left cheek  C44.319   2. Changing skin lesion  L98.9    We offered Mohs vs Excision in the office, patient will proceed with Mohs procedure. She is going to plan to schedule at the beginning of August. If she is unable to be seen for mohs resection by that time, she will be seen in the  office for an excision.   Follow up as needed, call with questions or concerns. Patient is aware to call us for follow up if Mohs cannot be scheduled soon enough.  Carola Rhine Jolean Madariaga, PA-C 02/08/2019, 3:46 PM
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
## 2019-07-03 ENCOUNTER — Other Ambulatory Visit: Payer: Self-pay | Admitting: Obstetrics and Gynecology

## 2019-07-03 DIAGNOSIS — Z1231 Encounter for screening mammogram for malignant neoplasm of breast: Secondary | ICD-10-CM

## 2019-07-06 ENCOUNTER — Other Ambulatory Visit: Payer: Self-pay

## 2019-07-06 ENCOUNTER — Ambulatory Visit
Admission: RE | Admit: 2019-07-06 | Discharge: 2019-07-06 | Disposition: A | Discharge status: Home / Self Care | Payer: 59 | Source: Ambulatory Visit

## 2019-07-06 DIAGNOSIS — Z1231 Encounter for screening mammogram for malignant neoplasm of breast: Secondary | ICD-10-CM

## 2019-07-15 ENCOUNTER — Ambulatory Visit: Payer: Managed Care, Other (non HMO) | Admitting: Orthopaedic Surgery

## 2019-07-15 ENCOUNTER — Other Ambulatory Visit: Payer: Self-pay

## 2019-07-15 ENCOUNTER — Encounter: Payer: Self-pay | Admitting: Orthopaedic Surgery

## 2019-07-15 ENCOUNTER — Telehealth: Payer: Self-pay

## 2019-07-15 DIAGNOSIS — M1711 Unilateral primary osteoarthritis, right knee: Secondary | ICD-10-CM

## 2019-07-15 DIAGNOSIS — M25561 Pain in right knee: Secondary | ICD-10-CM | POA: Diagnosis not present

## 2019-07-15 DIAGNOSIS — Z9889 Other specified postprocedural states: Secondary | ICD-10-CM | POA: Diagnosis not present

## 2019-07-15 DIAGNOSIS — G8929 Other chronic pain: Secondary | ICD-10-CM

## 2019-07-15 MED ORDER — LIDOCAINE HCL 1 % IJ SOLN
3.0000 mL | INTRAMUSCULAR | Status: AC | PRN
  Administered 2019-07-15: 3 mL

## 2019-07-15 MED ORDER — METHYLPREDNISOLONE ACETATE 40 MG/ML IJ SUSP
40.0000 mg | INTRAMUSCULAR | Status: AC | PRN
  Administered 2019-07-15: 40 mg via INTRA_ARTICULAR

## 2019-07-15 NOTE — Telephone Encounter (Signed)
Noted  

## 2019-07-15 NOTE — Telephone Encounter (Signed)
Right knee Monovisc

## 2019-07-15 NOTE — Progress Notes (Signed)
Office Visit Note   Patient: Jenny Baker           Date of Birth: Mar 03, 1961           MRN: [id number] Visit Date: 07/15/2019              Requested by: Martinique, Julie M, NP [place] [place],  [place] [id number] PCP: Martinique, Julie M, NP   Assessment & Plan: Visit Diagnoses:  1. Status post arthroscopy of right knee   2. Chronic pain of right knee     Plan: I did provide a steroid injection in her right knee today and we will order Monovisc for injection to place and hopefully a month from now.  I do feel it is warranted to obtain an MRI of her right knee to better assess the cartilage at this standpoint to help make a determination of whether or not she may benefit at some point from a partial knee arthroplasty.  All questions and concerns were answered and addressed.  Follow-Up Instructions: Return in about 4 weeks (around 08/12/2019).   Orders:  Orders Placed This Encounter  Procedures  . Large Joint Inj   No orders of the defined types were placed in this encounter.     Procedures: Large Joint Inj: R knee on 07/15/2019 8:43 AM Indications: diagnostic evaluation and pain Details: 22 G 1.5 in needle, superolateral approach  Arthrogram: No  Medications: 3 mL lidocaine 1 %; 40 mg methylPREDNISolone acetate 40 MG/ML Outcome: tolerated well, no immediate complications Procedure, treatment alternatives, risks and benefits explained, specific risks discussed. Consent was given by the patient. Immediately prior to procedure a time out was called to verify the correct patient, procedure, equipment, support staff and site/side marked as required. Patient was prepped and draped in the usual sterile fashion.       Clinical Data: No additional findings.   Subjective: No chief complaint on file. The patient is here today for her right knee.  She is a 58 year old female that we performed an arthroscopic intervention of in early 2019 for the right knee.  She  had areas of significant cartilage loss in the medial compartment of her knee as well as a large medial meniscal tear.  The remainder of her knee looks good.  She is developing symptoms again in terms of pain and swelling of the right knee.  She points to the medial joint line as the source of her pain.  We did provide a hyaluronic acid injection in that right knee over a year ago.  She has had no other acute change in her medical status other than significant right knee pain.  She is a very active individual.  HPI  Review of Systems She currently denies any headache, chest pain, shortness of breath, fever, chills, nausea, vomiting  Objective: Vital Signs: There were no vitals taken for this visit.  Physical Exam She is alert and orient x3 and in no acute distress Ortho Exam Examination of her right knee does show slight varus malalignment.  There is a mild effusion.  Her range of motion is full and the knee is ligamentously stable.  There is significant medial joint line tenderness. Specialty Comments:  No specialty comments available.  Imaging: No results found.   PMFS History: Patient Active Problem List   Diagnosis Date Noted  . Basal cell carcinoma of left cheek 02/08/2019  . Changing skin lesion 12/21/2018  . History of basal cell carcinoma (BCC) 12/21/2018  .  Chronic pain of right knee 09/06/2017   History reviewed. No pertinent past medical history.  History reviewed. No pertinent family history.  Past Surgical History:  Procedure Laterality Date  . APPENDECTOMY    . CESAREAN SECTION    . KNEE ARTHROSCOPY     Social History   Occupational History  . Not on file  Tobacco Use  . Smoking status: Never Smoker  . Smokeless tobacco: Never Used  Substance and Sexual Activity  . Alcohol use: No  . Drug use: No  . Sexual activity: Not on file

## 2019-07-29 ENCOUNTER — Other Ambulatory Visit: Payer: 59

## 2019-07-29 ENCOUNTER — Telehealth: Payer: Self-pay

## 2019-07-29 NOTE — Telephone Encounter (Signed)
Submitted VOB for SynviscOne, right knee. 

## 2019-07-30 ENCOUNTER — Telehealth: Payer: Self-pay

## 2019-07-30 NOTE — Telephone Encounter (Signed)
PA required for SynviscOne, right knee. Faxed completed PA form to [place] at [phone].

## 2019-07-31 ENCOUNTER — Telehealth: Payer: Self-pay

## 2019-07-31 NOTE — Telephone Encounter (Signed)
Received form from [facility] for additional information for SynviscOne, right knee. Completed form and faxed back to [employer] at [phone].

## 2019-08-02 ENCOUNTER — Telehealth: Payer: Self-pay

## 2019-08-02 NOTE — Telephone Encounter (Signed)
Talked with patient and advised her that she is approved for gel injection.  Approved for SynviscOne, right knee. Buy & Bill Must meet deductible first Patient will be responsible for 20% OOP Co-pay of $30.00 required PA required PA Approval#[id number] Valid 08/14/2119- 08/12/2020  Appt. 08/14/2019 with Dr. Ninfa Linden

## 2019-08-09 ENCOUNTER — Ambulatory Visit
Admission: RE | Admit: 2019-08-09 | Discharge: 2019-08-09 | Disposition: A | Discharge status: Home / Self Care | Payer: 59 | Source: Ambulatory Visit | Attending: Orthopaedic Surgery | Admitting: Orthopaedic Surgery

## 2019-08-09 DIAGNOSIS — M1711 Unilateral primary osteoarthritis, right knee: Secondary | ICD-10-CM

## 2019-08-09 DIAGNOSIS — Z9889 Other specified postprocedural states: Secondary | ICD-10-CM

## 2019-08-09 DIAGNOSIS — G8929 Other chronic pain: Secondary | ICD-10-CM

## 2019-08-09 DIAGNOSIS — M25561 Pain in right knee: Secondary | ICD-10-CM

## 2019-08-14 ENCOUNTER — Ambulatory Visit: Payer: 59 | Admitting: Orthopaedic Surgery

## 2019-08-19 ENCOUNTER — Encounter: Payer: Self-pay | Admitting: Orthopaedic Surgery

## 2019-08-19 ENCOUNTER — Other Ambulatory Visit: Payer: Self-pay

## 2019-08-19 ENCOUNTER — Ambulatory Visit (INDEPENDENT_AMBULATORY_CARE_PROVIDER_SITE_OTHER): Payer: 59 | Admitting: Orthopaedic Surgery

## 2019-08-19 DIAGNOSIS — M1711 Unilateral primary osteoarthritis, right knee: Secondary | ICD-10-CM | POA: Diagnosis not present

## 2019-08-19 DIAGNOSIS — G8929 Other chronic pain: Secondary | ICD-10-CM

## 2019-08-19 DIAGNOSIS — M25561 Pain in right knee: Secondary | ICD-10-CM

## 2019-08-19 MED ORDER — HYLAN G-F 20 48 MG/6ML IX SOSY
48.0000 mg | PREFILLED_SYRINGE | INTRA_ARTICULAR | Status: AC | PRN
  Administered 2019-08-19: 48 mg via INTRA_ARTICULAR

## 2019-08-19 NOTE — Progress Notes (Signed)
   Procedure Note  Patient: Jenny Baker             Date of Birth: 1961/04/26           MRN: [id number]             Visit Date: 08/19/2019  Procedures: Visit Diagnoses:  1. Chronic pain of right knee   2. Unilateral primary osteoarthritis, right knee     Large Joint Inj: R knee on 08/19/2019 4:29 PM Indications: pain and diagnostic evaluation Details: 22 G 1.5 in needle, superolateral approach  Arthrogram: No  Medications: 48 mg Hylan 48 MG/6ML Outcome: tolerated well, no immediate complications Procedure, treatment alternatives, risks and benefits explained, specific risks discussed. Consent was given by the patient. Immediately prior to procedure a time out was called to verify the correct patient, procedure, equipment, support staff and site/side marked as required. Patient was prepped and draped in the usual sterile fashion.     The patient is here today for Synvisc 1 injection in her right knee to treat the pain from known osteoarthritis.  She is an avid runner and only 59 years old.  We have performed arthroscopic surgery on that knee before.  A recent MRI was obtained that showed worsening edema in the medial compartment with the medial femoral condyle and medial tibial plateau which is subchondral edema.  There is worsening meniscal tear and areas of full-thickness cartilage loss.  She does have moderate chondrosis of the lateral compartment of her knee as well as the patellofemoral joint.  There is evidence of an anterior horn lateral meniscal tear on that exam as well.  We had a long and thorough discussion about her knee.  She is going avoid any high impact aerobic activities.  I do feel that she is a candidate for Synvisc 1 and placed this in her knee without difficulty.  The only other treatment options at this point would be a knee replacement which would be a total knee given the arthritis in her knee.  All question concerns were answered and addressed.  If this worsens anyway  she will let us know.

## 2019-11-18 ENCOUNTER — Encounter: Payer: Self-pay | Admitting: Orthopaedic Surgery

## 2019-11-18 ENCOUNTER — Ambulatory Visit (INDEPENDENT_AMBULATORY_CARE_PROVIDER_SITE_OTHER): Payer: 59 | Admitting: Orthopaedic Surgery

## 2019-11-18 ENCOUNTER — Other Ambulatory Visit: Payer: Self-pay

## 2019-11-18 DIAGNOSIS — M1711 Unilateral primary osteoarthritis, right knee: Secondary | ICD-10-CM | POA: Diagnosis not present

## 2019-11-18 DIAGNOSIS — G8929 Other chronic pain: Secondary | ICD-10-CM

## 2019-11-18 DIAGNOSIS — Z9889 Other specified postprocedural states: Secondary | ICD-10-CM | POA: Diagnosis not present

## 2019-11-18 DIAGNOSIS — M25561 Pain in right knee: Secondary | ICD-10-CM | POA: Diagnosis not present

## 2019-11-18 MED ORDER — METHYLPREDNISOLONE ACETATE 40 MG/ML IJ SUSP
40.0000 mg | INTRAMUSCULAR | Status: AC | PRN
  Administered 2019-11-18: 40 mg via INTRA_ARTICULAR

## 2019-11-18 MED ORDER — LIDOCAINE HCL 1 % IJ SOLN
3.0000 mL | INTRAMUSCULAR | Status: AC | PRN
  Administered 2019-11-18: 3 mL

## 2019-11-18 NOTE — Progress Notes (Signed)
Office Visit Note   Patient: Jenny Baker           Date of Birth: 08-13-1960           MRN: [id number] Visit Date: 11/18/2019              Requested by: Jenny Baker [place] [address],  [place] [address] PCP: Jenny Baker   Assessment & Plan: Visit Diagnoses:  1. Chronic pain of right knee   2. Unilateral primary osteoarthritis, right knee   3. Status post arthroscopy of right knee     Plan: I did place a steroid injection in her right knee today.  I also showed her knee replacement model and talked about what knee replacement surgery involves.  We had a long thorough discussion about the interoperative and postoperative course and the intricate nature of knee replacement surgery.  I discussed the risk and benefits of surgery as well.  I gave her our surgery scheduler's card.  She would like to consider knee replacement sometime in early August.  She will call and let us know so we can schedule this.  All questions and concerns were answered and addressed.  Follow-Up Instructions: Return for 2 weeks post-op.   Orders:  Orders Placed This Encounter  Procedures  . Large Joint Inj: R knee   No orders of the defined types were placed in this encounter.     Procedures: Large Joint Inj: R knee on 11/18/2019 4:41 PM Indications: diagnostic evaluation and pain Details: 22 G 1.5 in needle, superolateral approach  Arthrogram: No  Medications: 3 mL lidocaine 1 %; 40 mg methylPREDNISolone acetate 40 MG/ML Outcome: tolerated well, no immediate complications Procedure, treatment alternatives, risks and benefits explained, specific risks discussed. Consent was given by the patient. Immediately prior to procedure a time out was called to verify the correct patient, procedure, equipment, support staff and site/side marked as required. Patient was prepped and draped in the usual sterile fashion.       Clinical Data: No additional findings.    Subjective: Chief Complaint  Patient presents with  . Right Knee - Follow-up  [name] is well-known to me.  I been following her for her right knee for some time now.  She has developed worsening arthritis in that right knee in all 3 compartments.  She has had originally an arthroscopic intervention for complex medial meniscal tear.  At the time of surgery we did find significant cartilage wear on the medial compartment of her knee.  She has tried multiple steroid injections as well as hyaluronic acid for the right knee.  At this point her right knee pain is daily and it is detrimentally affecting her mobility, her quality of life and her activities of daily living.  She is at the point where she would like to have a steroid injection today but also would like to consider knee replacement surgery later this year.  We did obtain a MRI in January of this year.  The MRI did show worsening arthritis in the medial compartment of her knee but also moderate cartilage changes on the lateral aspect and at the patellofemoral joint thus necessitating more of a total knee arthroplasty as opposed to a partial knee arthroplasty.  She has had no other acute change in her medical status.  She did go hiking recently and her knee has been very painful since then.  She is requesting a steroid injection today.  HPI  Review of Systems There is currently no headache, chest pain, shortness of breath, fever, chills, nausea, vomiting  Objective: Vital Signs: There were no vitals taken for this visit.  Physical Exam She is alert and orient x3 and in no acute distress Ortho Exam Examination of her right knee does show varus malalignment.  There is no significant effusion but significant patellofemoral crepitation.  Her varus malalignment is correctable.  Her knee is ligamentously stable. Specialty Comments:  No specialty comments available.  Imaging: No results found.   PMFS History: Patient Active Problem List    Diagnosis Date Noted  . Unilateral primary osteoarthritis, right knee 11/18/2019  . Basal cell carcinoma of left cheek 02/08/2019  . Changing skin lesion 12/21/2018  . History of basal cell carcinoma (BCC) 12/21/2018  . Chronic pain of right knee 09/06/2017   History reviewed. No pertinent past medical history.  History reviewed. No pertinent family history.  Past Surgical History:  Procedure Laterality Date  . APPENDECTOMY    . CESAREAN SECTION    . KNEE ARTHROSCOPY     Social History   Occupational History  . Not on file  Tobacco Use  . Smoking status: Never Smoker  . Smokeless tobacco: Never Used  Substance and Sexual Activity  . Alcohol use: No  . Drug use: No  . Sexual activity: Not on file

## 2019-12-06 ENCOUNTER — Other Ambulatory Visit: Payer: Self-pay

## 2020-01-07 NOTE — Progress Notes (Addendum)
PCP - Julie Martinique, NP  Cardiologist - N/A  No back stimulator    PPM/ICD -  Device Orders -  Rep Notified -   Chest x-ray -  EKG -  Stress Test -  ECHO -  Cardiac Cath -   Sleep Study -  CPAP -   Fasting Blood Sugar -  Checks Blood Sugar _____ times a day  Blood Thinner Instructions: Aspirin Instructions:  ERAS Protcol - PRE-SURGERY Ensure or G2-   COVID TEST-   Covid Vaccination x 2 ([employer])  Activity- No sob report with any activity   Anesthesia review:   Patient denies shortness of breath, fever, cough and chest pain at PAT appointment   All instructions explained to the patient, with a verbal understanding of the material. Patient agrees to go over the instructions while at home for a better understanding. Patient also instructed to self quarantine after being tested for COVID-19. The opportunity to ask questions was provided.

## 2020-01-07 NOTE — Progress Notes (Signed)
Please place surgery orders.  

## 2020-01-07 NOTE — Patient Instructions (Addendum)
DUE TO COVID-19 ONLY ONE VISITOR IS ALLOWED TO COME WITH YOU AND STAY IN THE WAITING ROOM ONLY DURING PRE OP AND PROCEDURE DAY OF SURGERY. THE 1 VISITOR MAY VISIT WITH YOU AFTER SURGERY IN YOUR PRIVATE ROOM DURING VISITING HOURS ONLY!  YOU NEED TO HAVE A COVID 19 TEST ON 01-14-20 @ 3:05 PM, THIS TEST MUST BE DONE BEFORE SURGERY, COME  [place], [place] , [address].  ([place]) ONCE YOUR COVID TEST IS COMPLETED, PLEASE BEGIN THE QUARANTINE INSTRUCTIONS AS OUTLINED IN YOUR HANDOUT.                Jenny Baker  01/07/2020   Your procedure is scheduled on: 01-17-20   Report to [facility] Main  Entrance    Report to Admitting at  5:30 AM    Call this number if you have problems the morning of surgery [phone]    Remember: NO SOLID FOOD AFTER MIDNIGHT THE NIGHT PRIOR TO SURGERY. NOTHING BY MOUTH EXCEPT CLEAR LIQUIDS UNTIL 4:15 AM. PLEASE FINISH ENSURE DRINK PER SURGEON ORDER  WHICH NEEDS TO BE COMPLETED AT 4:15 AM .   CLEAR LIQUID DIET   Foods Allowed                                                                     Foods Excluded  Coffee and tea, regular and decaf                             liquids that you cannot  Plain Jell-O any favor except red or purple                                           see through such as: Fruit ices (not with fruit pulp)                                     milk, soups, orange juice  Iced Popsicles                                    All solid food Carbonated beverages, regular and diet                                    Cranberry, grape and apple juices Sports drinks like Gatorade Lightly seasoned clear broth or consume(fat free) Sugar, honey syrup   _____________________________________________________________________     Take these medicines the morning of surgery with A SIP OF WATER: Loratadine (Claritin)  BRUSH YOUR TEETH MORNING OF SURGERY AND RINSE YOUR MOUTH OUT, NO CHEWING GUM CANDY OR MINTS.                               You may not have any metal on your body including hair pins and  piercings     Do not wear jewelry, makeup, lotions, powders or deodorant                      Men may shave face and neck.   Do not bring valuables to the hospital. [place].  Contacts, dentures or bridgework may not be worn into surgery.  You may bring small overnight bag   Special Instructions: N/A              Please read over the following fact sheets you were given: _____________________________________________________________________             [facility] - Preparing for Surgery Before surgery, you can play an important role.  Because skin is not sterile, your skin needs to be as free of germs as possible.  You can reduce the number of germs on your skin by washing with CHG (chlorahexidine gluconate) soap before surgery.  CHG is an antiseptic cleaner which kills germs and bonds with the skin to continue killing germs even after washing. Please DO NOT use if you have an allergy to CHG or antibacterial soaps.  If your skin becomes reddened/irritated stop using the CHG and inform your nurse when you arrive at Short Stay. Do not shave (including legs and underarms) for at least 48 hours prior to the first CHG shower.  You may shave your face/neck. Please follow these instructions carefully:  1.  Shower with CHG Soap the night before surgery and the  morning of Surgery.  2.  If you choose to wash your hair, wash your hair first as usual with your  normal  shampoo.  3.  After you shampoo, rinse your hair and body thoroughly to remove the  shampoo.                           4.  Use CHG as you would any other liquid soap.  You can apply chg directly  to the skin and wash                       Gently with a scrungie or clean washcloth.  5.  Apply the CHG Soap to your body ONLY FROM THE NECK DOWN.   Do not use on face/ open                            Wound or open sores. Avoid contact with eyes, ears mouth and genitals (private parts).                       Wash face,  Genitals (private parts) with your normal soap.             6.  Wash thoroughly, paying special attention to the area where your surgery  will be performed.  7.  Thoroughly rinse your body with warm water from the neck down.  8.  DO NOT shower/wash with your normal soap after using and rinsing off  the CHG Soap.                9.  Pat yourself dry with a clean towel.            10.  Wear clean pajamas.  11.  Place clean sheets on your bed the night of your first shower and do not  sleep with pets. Day of Surgery : Do not apply any lotions/deodorants the morning of surgery.  Please wear clean clothes to the hospital/surgery center.  FAILURE TO FOLLOW THESE INSTRUCTIONS MAY RESULT IN THE CANCELLATION OF YOUR SURGERY PATIENT SIGNATURE_________________________________  NURSE SIGNATURE__________________________________  ________________________________________________________________________   Adam Phenix  An incentive spirometer is a tool that can help keep your lungs clear and active. This tool measures how well you are filling your lungs with each breath. Taking long deep breaths may help reverse or decrease the chance of developing breathing (pulmonary) problems (especially infection) following:  A long period of time when you are unable to move or be active. BEFORE THE PROCEDURE   If the spirometer includes an indicator to show your best effort, your nurse or respiratory therapist will set it to a desired goal.  If possible, sit up straight or lean slightly forward. Try not to slouch.  Hold the incentive spirometer in an upright position. INSTRUCTIONS FOR USE  1. Sit on the edge of your bed if possible, or sit up as far as you can in bed or on a chair. 2. Hold the incentive spirometer in an upright position. 3. Breathe out  normally. 4. Place the mouthpiece in your mouth and seal your lips tightly around it. 5. Breathe in slowly and as deeply as possible, raising the piston or the ball toward the top of the column. 6. Hold your breath for 3-5 seconds or for as long as possible. Allow the piston or ball to fall to the bottom of the column. 7. Remove the mouthpiece from your mouth and breathe out normally. 8. Rest for a few seconds and repeat Steps 1 through 7 at least 10 times every 1-2 hours when you are awake. Take your time and take a few normal breaths between deep breaths. 9. The spirometer may include an indicator to show your best effort. Use the indicator as a goal to work toward during each repetition. 10. After each set of 10 deep breaths, practice coughing to be sure your lungs are clear. If you have an incision (the cut made at the time of surgery), support your incision when coughing by placing a pillow or rolled up towels firmly against it. Once you are able to get out of bed, walk around indoors and cough well. You may stop using the incentive spirometer when instructed by your caregiver.  RISKS AND COMPLICATIONS  Take your time so you do not get dizzy or light-headed.  If you are in pain, you may need to take or ask for pain medication before doing incentive spirometry. It is harder to take a deep breath if you are having pain. AFTER USE  Rest and breathe slowly and easily.  It can be helpful to keep track of a log of your progress. Your caregiver can provide you with a simple table to help with this. If you are using the spirometer at home, follow these instructions: [place] IF:   You are having difficultly using the spirometer.  You have trouble using the spirometer as often as instructed.  Your pain medication is not giving enough relief while using the spirometer.  You develop fever of 100.5 F (38.1 C) or higher. SEEK IMMEDIATE MEDICAL CARE IF:   You cough up bloody sputum  that had not been present before.  You develop fever of 102 F (38.9 C)  or greater.  You develop worsening pain at or near the incision site. MAKE SURE YOU:   Understand these instructions.  Will watch your condition.  Will get help right away if you are not doing well or get worse. Document Released: 11/21/2006 Document Revised: 10/03/2011 Document Reviewed: 01/22/2007 [facility] Patient Information 2014 [place], [place].   ________________________________________________________________________

## 2020-01-08 ENCOUNTER — Other Ambulatory Visit: Payer: Self-pay | Admitting: Family

## 2020-01-09 ENCOUNTER — Encounter (HOSPITAL_COMMUNITY)
Admission: RE | Admit: 2020-01-09 | Discharge: 2020-01-09 | Disposition: A | Discharge status: Home / Self Care | Payer: 59 | Source: Ambulatory Visit | Attending: Orthopaedic Surgery | Admitting: Orthopaedic Surgery

## 2020-01-09 ENCOUNTER — Other Ambulatory Visit: Payer: Self-pay

## 2020-01-09 ENCOUNTER — Encounter (HOSPITAL_COMMUNITY): Payer: Self-pay

## 2020-01-09 DIAGNOSIS — I498 Other specified cardiac arrhythmias: Secondary | ICD-10-CM | POA: Diagnosis not present

## 2020-01-09 DIAGNOSIS — Z01818 Encounter for other preprocedural examination: Secondary | ICD-10-CM | POA: Insufficient documentation

## 2020-01-09 HISTORY — DX: Unspecified osteoarthritis, unspecified site: M19.90

## 2020-01-09 HISTORY — DX: Malignant (primary) neoplasm, unspecified: C80.1

## 2020-01-09 HISTORY — DX: Dizziness and giddiness: R42

## 2020-01-09 HISTORY — DX: Headache, unspecified: R51.9

## 2020-01-09 LAB — BASIC METABOLIC PANEL
Anion gap: 10 (ref 5–15)
BUN: 20 mg/dL (ref 6–20)
CO2: 27 mmol/L (ref 22–32)
Calcium: 9.4 mg/dL (ref 8.9–10.3)
Chloride: 103 mmol/L (ref 98–111)
Creatinine, Ser: 0.87 mg/dL (ref 0.44–1.00)
GFR calc Af Amer: >60 mL/min (ref >60)
GFR calc non Af Amer: >60 mL/min (ref >60)
Glucose, Bld: 92 mg/dL (ref 70–99)
Potassium: 4.5 mmol/L (ref 3.5–5.1)
Sodium: 140 mmol/L (ref 135–145)

## 2020-01-09 LAB — CBC
HCT: 39.9 % (ref 36.0–46.0)
Hemoglobin: 13 g/dL (ref 12.0–15.0)
MCH: 30 pg (ref 26.0–34.0)
MCHC: 32.6 g/dL (ref 30.0–36.0)
MCV: 91.9 fL (ref 80.0–100.0)
Platelets: 248 10*3/uL (ref 150–400)
RBC: 4.34 MIL/uL (ref 3.87–5.11)
RDW: 12.3 % (ref 11.5–15.5)
WBC: 7.3 10*3/uL (ref 4.0–10.5)
nRBC: 0 % (ref 0.0–0.2)

## 2020-01-09 LAB — SURGICAL PCR SCREEN
MRSA, PCR: NEGATIVE
Staphylococcus aureus: NEGATIVE

## 2020-01-14 ENCOUNTER — Other Ambulatory Visit (HOSPITAL_COMMUNITY)
Admission: RE | Admit: 2020-01-14 | Discharge: 2020-01-14 | Disposition: A | Discharge status: Home / Self Care | Payer: 59 | Source: Ambulatory Visit | Attending: Orthopaedic Surgery | Admitting: Orthopaedic Surgery

## 2020-01-14 DIAGNOSIS — Z01812 Encounter for preprocedural laboratory examination: Secondary | ICD-10-CM | POA: Insufficient documentation

## 2020-01-14 DIAGNOSIS — Z20822 Contact with and (suspected) exposure to covid-19: Secondary | ICD-10-CM | POA: Insufficient documentation

## 2020-01-14 LAB — SARS CORONAVIRUS 2 (TAT 6-24 HRS): SARS Coronavirus 2: NEGATIVE

## 2020-01-16 NOTE — H&P (Signed)
TOTAL KNEE ADMISSION H&P  Patient is being admitted for right total knee arthroplasty.  Subjective:  Chief Complaint:right knee pain.  HPI: Jenny Baker, 59 y.o. female, has a history of pain and functional disability in the right knee due to arthritis and has failed non-surgical conservative treatments for greater than 12 weeks to includeNSAID's and/or analgesics, corticosteriod injections, viscosupplementation injections, flexibility and strengthening excercises and activity modification.  Onset of symptoms was gradual, starting 3 years ago with gradually worsening course since that time. The patient noted prior procedures on the knee to include  arthroscopy on the right knee(s).  Patient currently rates pain in the right knee(s) at 9 out of 10 with activity. Patient has night pain, worsening of pain with activity and weight bearing, pain that interferes with activities of daily living, pain with passive range of motion, crepitus and joint swelling.  Patient has evidence of subchondral sclerosis, periarticular osteophytes and joint space narrowing by imaging studies. There is no active infection.  Patient Active Problem List   Diagnosis Date Noted  . Unilateral primary osteoarthritis, right knee 11/18/2019  . Basal cell carcinoma of left cheek 02/08/2019  . Changing skin lesion 12/21/2018  . History of basal cell carcinoma (BCC) 12/21/2018  . Chronic pain of right knee 09/06/2017   Past Medical History:  Diagnosis Date  . Arthritis   . Cancer (HCC)    Basal cell on L side of nose  . Headache    Related to previous concussion   . Vertigo    Current episode 5-21    Past Surgical History:  Procedure Laterality Date  . APPENDECTOMY    . CESAREAN SECTION    . KNEE ARTHROSCOPY      No current facility-administered medications for this encounter.   Current Outpatient Medications  Medication Sig Dispense Refill Last Dose  . acetaminophen (TYLENOL) 500 MG tablet Take 1,000 mg by mouth  every 6 (six) hours as needed for mild pain or moderate pain. (Patient not taking: Reported on 01/09/2020)     . CALCIUM PO Take 1 tablet by mouth daily. Vitamin vit D /K 2 (Patient not taking: Reported on 01/09/2020)     . ibuprofen (ADVIL,MOTRIN) 600 MG tablet Take 600 mg by mouth every 6 (six) hours as needed for mild pain or moderate pain.  (Patient not taking: Reported on 01/09/2020)     . Krill Oil 300 MG CAPS Take 600 mg by mouth daily. (Patient not taking: Reported on 01/09/2020)     . loratadine (CLARITIN) 10 MG tablet Take 10 mg by mouth daily.     Marland Kitchen OVER THE COUNTER MEDICATION Take 1 mL by mouth daily. CBD oil (Patient not taking: Reported on 01/09/2020)     . TURMERIC PO Take 1 tablet by mouth daily. Joint supplement (Patient not taking: Reported on 01/09/2020)      No Known Allergies  Social History   Tobacco Use  . Smoking status: Never Smoker  . Smokeless tobacco: Never Used  Substance Use Topics  . Alcohol use: No    No family history on file.   Review of Systems  Musculoskeletal: Positive for joint swelling.  All other systems reviewed and are negative.   Objective:  Physical Exam  Vitals reviewed. Constitutional: She is oriented to person, place, and time.  HENT:  Head: Normocephalic and atraumatic.  Eyes: Pupils are equal, round, and reactive to light.  Cardiovascular: Normal rate, regular rhythm and normal pulses.  Respiratory: Effort normal and breath sounds normal.  GI: Soft. Normal appearance.  Musculoskeletal:     Right knee: Swelling, effusion and bony tenderness present. Decreased range of motion. Tenderness present over the medial joint line. Abnormal meniscus.  Neurological: She is alert and oriented to person, place, and time.  Skin: Skin is warm.  Psychiatric: Her behavior is normal.    Vital signs in last 24 hours:    Labs:   Estimated body mass index is 23.57 kg/m as calculated from the following:   Height as of 01/09/20: 5\' 8"  (1.727 m).    Weight as of 01/09/20: 70.3 kg.   Imaging Review Plain radiographs demonstrate severe degenerative joint disease of the right knee(s). The overall alignment isneutral. The bone quality appears to be excellent for age and reported activity level.      Assessment/Plan:  End stage arthritis, right knee   The patient history, physical examination, clinical judgment of the provider and imaging studies are consistent with end stage degenerative joint disease of the right knee(s) and total knee arthroplasty is deemed medically necessary. The treatment options including medical management, injection therapy arthroscopy and arthroplasty were discussed at length. The risks and benefits of total knee arthroplasty were presented and reviewed. The risks due to aseptic loosening, infection, stiffness, patella tracking problems, thromboembolic complications and other imponderables were discussed. The patient acknowledged the explanation, agreed to proceed with the plan and consent was signed. Patient is being admitted for inpatient treatment for surgery, pain control, PT, OT, prophylactic antibiotics, VTE prophylaxis, progressive ambulation and ADL's and discharge planning. The patient is planning to be discharged home with home health services

## 2020-01-16 NOTE — Anesthesia Preprocedure Evaluation (Addendum)
Anesthesia Evaluation  Patient identified by MRN, date of birth, ID band Patient awake    Reviewed: Allergy & Precautions, NPO status , Patient's Chart, lab work & pertinent test results  History of Anesthesia Complications Negative for: history of anesthetic complications  Airway Mallampati: II  TM Distance: >3 FB Neck ROM: Full    Dental  (+) Teeth Intact   Pulmonary neg pulmonary ROS,    Pulmonary exam normal        Cardiovascular negative cardio ROS Normal cardiovascular exam     Neuro/Psych negative neurological ROS  negative psych ROS   GI/Hepatic negative GI ROS, Neg liver ROS,   Endo/Other  negative endocrine ROS  Renal/GU negative Renal ROS  negative genitourinary   Musculoskeletal  (+) Arthritis , Osteoarthritis,    Abdominal   Peds  Hematology negative hematology ROS (+)   Anesthesia Other Findings  Hgb 13.0, Plts 248  Reproductive/Obstetrics                            Anesthesia Physical Anesthesia Plan  ASA: I  Anesthesia Plan: Spinal   Post-op Pain Management:    Induction:   PONV Risk Score and Plan: 2 and Propofol infusion, Treatment may vary due to age or medical condition, Ondansetron and TIVA  Airway Management Planned: Nasal Cannula and Simple Face Mask  Additional Equipment: None  Intra-op Plan:   Post-operative Plan:   Informed Consent: I have reviewed the patients History and Physical, chart, labs and discussed the procedure including the risks, benefits and alternatives for the proposed anesthesia with the patient or authorized representative who has indicated his/her understanding and acceptance.     Dental advisory given  Plan Discussed with:   Anesthesia Plan Comments:        Anesthesia Quick Evaluation

## 2020-01-17 ENCOUNTER — Observation Stay (HOSPITAL_COMMUNITY): Payer: 59

## 2020-01-17 ENCOUNTER — Ambulatory Visit (HOSPITAL_COMMUNITY): Payer: 59 | Admitting: Anesthesiology

## 2020-01-17 ENCOUNTER — Observation Stay (HOSPITAL_COMMUNITY)
Admission: RE | Admit: 2020-01-17 | Discharge: 2020-01-19 | Disposition: A | Discharge status: Home / Self Care | Payer: 59 | Attending: Orthopaedic Surgery | Admitting: Orthopaedic Surgery

## 2020-01-17 ENCOUNTER — Encounter (HOSPITAL_COMMUNITY): Payer: Self-pay | Admitting: Orthopaedic Surgery

## 2020-01-17 ENCOUNTER — Other Ambulatory Visit: Payer: Self-pay

## 2020-01-17 ENCOUNTER — Encounter (HOSPITAL_COMMUNITY)
Admission: RE | Disposition: A | Discharge status: Home / Self Care | Payer: Self-pay | Source: Home / Self Care | Attending: Orthopaedic Surgery

## 2020-01-17 DIAGNOSIS — Z85828 Personal history of other malignant neoplasm of skin: Secondary | ICD-10-CM | POA: Insufficient documentation

## 2020-01-17 DIAGNOSIS — Z96651 Presence of right artificial knee joint: Secondary | ICD-10-CM

## 2020-01-17 DIAGNOSIS — M1711 Unilateral primary osteoarthritis, right knee: Principal | ICD-10-CM | POA: Diagnosis present

## 2020-01-17 HISTORY — PX: TOTAL KNEE ARTHROPLASTY: SHX125

## 2020-01-17 SURGERY — ARTHROPLASTY, KNEE, TOTAL
Anesthesia: Spinal | Site: Knee | Laterality: Right

## 2020-01-17 MED ORDER — STERILE WATER FOR IRRIGATION IR SOLN
Status: DC | PRN
  Administered 2020-01-17: 2000 mL

## 2020-01-17 MED ORDER — TRANEXAMIC ACID-NACL 1000-0.7 MG/100ML-% IV SOLN
1000.0000 mg | INTRAVENOUS | Status: AC
  Administered 2020-01-17: 1000 mg via INTRAVENOUS
  Filled 2020-01-17: qty 100

## 2020-01-17 MED ORDER — METHOCARBAMOL 500 MG PO TABS
500.0000 mg | ORAL_TABLET | Freq: Four times a day (QID) | ORAL | Status: DC | PRN
  Administered 2020-01-17 – 2020-01-19 (×4): 500 mg via ORAL
  Filled 2020-01-17 (×4): qty 1

## 2020-01-17 MED ORDER — METHOCARBAMOL 500 MG IVPB - SIMPLE MED
500.0000 mg | Freq: Four times a day (QID) | INTRAVENOUS | Status: DC | PRN
  Filled 2020-01-17: qty 50

## 2020-01-17 MED ORDER — ACETAMINOPHEN 325 MG PO TABS: 325.0000 mg | ORAL_TABLET | Freq: Four times a day (QID) | ORAL | Status: DC | PRN

## 2020-01-17 MED ORDER — POLYETHYLENE GLYCOL 3350 17 G PO PACK: 17.0000 g | PACK | Freq: Every day | ORAL | Status: DC | PRN

## 2020-01-17 MED ORDER — CHLORHEXIDINE GLUCONATE 0.12 % MT SOLN
15.0000 mL | Freq: Once | OROMUCOSAL | Status: AC
  Administered 2020-01-17: 15 mL via OROMUCOSAL

## 2020-01-17 MED ORDER — PANTOPRAZOLE SODIUM 40 MG PO TBEC
40.0000 mg | DELAYED_RELEASE_TABLET | Freq: Every day | ORAL | Status: DC
  Administered 2020-01-17 – 2020-01-19 (×3): 40 mg via ORAL
  Filled 2020-01-17 (×3): qty 1

## 2020-01-17 MED ORDER — GABAPENTIN 100 MG PO CAPS
100.0000 mg | ORAL_CAPSULE | Freq: Three times a day (TID) | ORAL | Status: DC
  Administered 2020-01-17 – 2020-01-18 (×4): 100 mg via ORAL
  Filled 2020-01-17 (×5): qty 1

## 2020-01-17 MED ORDER — ASPIRIN 81 MG PO CHEW
81.0000 mg | CHEWABLE_TABLET | Freq: Two times a day (BID) | ORAL | Status: DC
  Administered 2020-01-17 – 2020-01-19 (×4): 81 mg via ORAL
  Filled 2020-01-17 (×4): qty 1

## 2020-01-17 MED ORDER — SODIUM CHLORIDE 0.9 % IV SOLN: INTRAVENOUS | Status: DC

## 2020-01-17 MED ORDER — MIDAZOLAM HCL 2 MG/2ML IJ SOLN
INTRAMUSCULAR | Status: AC
  Filled 2020-01-17: qty 2

## 2020-01-17 MED ORDER — OXYCODONE HCL 5 MG/5ML PO SOLN: 5.0000 mg | Freq: Once | ORAL | Status: DC | PRN

## 2020-01-17 MED ORDER — ORAL CARE MOUTH RINSE: 15.0000 mL | Freq: Once | OROMUCOSAL | Status: AC

## 2020-01-17 MED ORDER — PROPOFOL 1000 MG/100ML IV EMUL
INTRAVENOUS | Status: AC
  Filled 2020-01-17: qty 100

## 2020-01-17 MED ORDER — 0.9 % SODIUM CHLORIDE (POUR BTL) OPTIME
TOPICAL | Status: DC | PRN
  Administered 2020-01-17: 1000 mL

## 2020-01-17 MED ORDER — FENTANYL CITRATE (PF) 100 MCG/2ML IJ SOLN
INTRAMUSCULAR | Status: DC | PRN
  Administered 2020-01-17: 100 ug via INTRAVENOUS

## 2020-01-17 MED ORDER — MIDAZOLAM HCL 5 MG/5ML IJ SOLN
INTRAMUSCULAR | Status: DC | PRN
  Administered 2020-01-17: 2 mg via INTRAVENOUS

## 2020-01-17 MED ORDER — SODIUM CHLORIDE 0.9 % IR SOLN
Status: DC | PRN
  Administered 2020-01-17: 1000 mL

## 2020-01-17 MED ORDER — DOCUSATE SODIUM 100 MG PO CAPS
100.0000 mg | ORAL_CAPSULE | Freq: Two times a day (BID) | ORAL | Status: DC
  Administered 2020-01-17 – 2020-01-18 (×2): 100 mg via ORAL
  Filled 2020-01-17 (×2): qty 1

## 2020-01-17 MED ORDER — KETOROLAC TROMETHAMINE 15 MG/ML IJ SOLN
7.5000 mg | Freq: Four times a day (QID) | INTRAMUSCULAR | Status: AC
  Administered 2020-01-17 – 2020-01-18 (×4): 7.5 mg via INTRAVENOUS
  Filled 2020-01-17 (×4): qty 1

## 2020-01-17 MED ORDER — PHENYLEPHRINE 40 MCG/ML (10ML) SYRINGE FOR IV PUSH (FOR BLOOD PRESSURE SUPPORT)
PREFILLED_SYRINGE | INTRAVENOUS | Status: DC | PRN
  Administered 2020-01-17 (×4): 80 ug via INTRAVENOUS

## 2020-01-17 MED ORDER — PROPOFOL 10 MG/ML IV BOLUS
INTRAVENOUS | Status: AC
  Filled 2020-01-17: qty 20

## 2020-01-17 MED ORDER — OXYCODONE HCL 5 MG PO TABS: 10.0000 mg | ORAL_TABLET | ORAL | Status: DC | PRN

## 2020-01-17 MED ORDER — BUPIVACAINE HCL (PF) 0.25 % IJ SOLN
INTRAMUSCULAR | Status: AC
  Filled 2020-01-17: qty 30

## 2020-01-17 MED ORDER — MENTHOL 3 MG MT LOZG: 1.0000 | LOZENGE | OROMUCOSAL | Status: DC | PRN

## 2020-01-17 MED ORDER — FENTANYL CITRATE (PF) 100 MCG/2ML IJ SOLN
INTRAMUSCULAR | Status: AC
  Filled 2020-01-17: qty 2

## 2020-01-17 MED ORDER — DIPHENHYDRAMINE HCL 12.5 MG/5ML PO ELIX: 12.5000 mg | ORAL_SOLUTION | ORAL | Status: DC | PRN

## 2020-01-17 MED ORDER — ONDANSETRON HCL 4 MG/2ML IJ SOLN
INTRAMUSCULAR | Status: DC | PRN
  Administered 2020-01-17: 4 mg via INTRAVENOUS

## 2020-01-17 MED ORDER — LORATADINE 10 MG PO TABS
10.0000 mg | ORAL_TABLET | Freq: Every day | ORAL | Status: DC
  Filled 2020-01-17: qty 1

## 2020-01-17 MED ORDER — PROPOFOL 500 MG/50ML IV EMUL
INTRAVENOUS | Status: DC | PRN
  Administered 2020-01-17: 100 ug/kg/min via INTRAVENOUS

## 2020-01-17 MED ORDER — OXYCODONE HCL 5 MG PO TABS: 5.0000 mg | ORAL_TABLET | Freq: Once | ORAL | Status: DC | PRN

## 2020-01-17 MED ORDER — PROPOFOL 10 MG/ML IV BOLUS
INTRAVENOUS | Status: DC | PRN
  Administered 2020-01-17: 30 mg via INTRAVENOUS

## 2020-01-17 MED ORDER — METOCLOPRAMIDE HCL 5 MG/ML IJ SOLN: 5.0000 mg | Freq: Three times a day (TID) | INTRAMUSCULAR | Status: DC | PRN

## 2020-01-17 MED ORDER — OXYCODONE HCL 5 MG PO TABS
5.0000 mg | ORAL_TABLET | ORAL | Status: DC | PRN
  Administered 2020-01-17 – 2020-01-18 (×4): 5 mg via ORAL
  Filled 2020-01-17 (×2): qty 1
  Filled 2020-01-17: qty 2
  Filled 2020-01-17: qty 1

## 2020-01-17 MED ORDER — CEFAZOLIN SODIUM-DEXTROSE 2-4 GM/100ML-% IV SOLN
2.0000 g | INTRAVENOUS | Status: AC
  Administered 2020-01-17: 2 g via INTRAVENOUS
  Filled 2020-01-17: qty 100

## 2020-01-17 MED ORDER — FENTANYL CITRATE (PF) 100 MCG/2ML IJ SOLN: 25.0000 ug | INTRAMUSCULAR | Status: DC | PRN

## 2020-01-17 MED ORDER — LACTATED RINGERS IV SOLN: INTRAVENOUS | Status: DC

## 2020-01-17 MED ORDER — ALUM & MAG HYDROXIDE-SIMETH 200-200-20 MG/5ML PO SUSP: 30.0000 mL | ORAL | Status: DC | PRN

## 2020-01-17 MED ORDER — CEFAZOLIN SODIUM-DEXTROSE 1-4 GM/50ML-% IV SOLN
1.0000 g | Freq: Four times a day (QID) | INTRAVENOUS | Status: AC
  Administered 2020-01-17 (×2): 1 g via INTRAVENOUS
  Filled 2020-01-17 (×3): qty 50

## 2020-01-17 MED ORDER — PHENOL 1.4 % MT LIQD: 1.0000 | OROMUCOSAL | Status: DC | PRN

## 2020-01-17 MED ORDER — ONDANSETRON HCL 4 MG/2ML IJ SOLN
4.0000 mg | Freq: Four times a day (QID) | INTRAMUSCULAR | Status: DC | PRN
  Administered 2020-01-18: 4 mg via INTRAVENOUS
  Filled 2020-01-17: qty 2

## 2020-01-17 MED ORDER — BUPIVACAINE IN DEXTROSE 0.75-8.25 % IT SOLN
INTRATHECAL | Status: DC | PRN
  Administered 2020-01-17: 1.6 mL via INTRATHECAL

## 2020-01-17 MED ORDER — ONDANSETRON HCL 4 MG/2ML IJ SOLN: 4.0000 mg | Freq: Once | INTRAMUSCULAR | Status: DC | PRN

## 2020-01-17 MED ORDER — PHENYLEPHRINE 40 MCG/ML (10ML) SYRINGE FOR IV PUSH (FOR BLOOD PRESSURE SUPPORT)
PREFILLED_SYRINGE | INTRAVENOUS | Status: AC
  Filled 2020-01-17: qty 10

## 2020-01-17 MED ORDER — POVIDONE-IODINE 10 % EX SWAB
2.0000 "application " | Freq: Once | CUTANEOUS | Status: AC
  Administered 2020-01-17: 2 via TOPICAL

## 2020-01-17 MED ORDER — DEXAMETHASONE SODIUM PHOSPHATE 10 MG/ML IJ SOLN
INTRAMUSCULAR | Status: DC | PRN
  Administered 2020-01-17: 10 mg via INTRAVENOUS

## 2020-01-17 MED ORDER — BUPIVACAINE HCL (PF) 0.25 % IJ SOLN
INTRAMUSCULAR | Status: DC | PRN
  Administered 2020-01-17: 30 mL

## 2020-01-17 MED ORDER — ONDANSETRON HCL 4 MG PO TABS
4.0000 mg | ORAL_TABLET | Freq: Four times a day (QID) | ORAL | Status: DC | PRN
  Administered 2020-01-18: 4 mg via ORAL
  Filled 2020-01-17: qty 1

## 2020-01-17 MED ORDER — ROPIVACAINE HCL 5 MG/ML IJ SOLN
INTRAMUSCULAR | Status: DC | PRN
  Administered 2020-01-17: 30 mL via PERINEURAL

## 2020-01-17 MED ORDER — METOCLOPRAMIDE HCL 5 MG PO TABS: 5.0000 mg | ORAL_TABLET | Freq: Three times a day (TID) | ORAL | Status: DC | PRN

## 2020-01-17 MED ORDER — HYDROMORPHONE HCL 1 MG/ML IJ SOLN: 0.5000 mg | INTRAMUSCULAR | Status: DC | PRN

## 2020-01-17 SURGICAL SUPPLY — 54 items
BAG ZIPLOCK 12X15 (MISCELLANEOUS) IMPLANT
BENZOIN TINCTURE PRP APPL 2/3 (GAUZE/BANDAGES/DRESSINGS) ×2 IMPLANT
BLADE SAG 18X100X1.27 (BLADE) ×2 IMPLANT
BLADE SURG SZ10 CARB STEEL (BLADE) ×4 IMPLANT
BNDG ELASTIC 6X5.8 VLCR STR LF (GAUZE/BANDAGES/DRESSINGS) ×2 IMPLANT
BOWL SMART MIX CTS (DISPOSABLE) IMPLANT
CLSR STERI-STRIP ANTIMIC 1/2X4 (GAUZE/BANDAGES/DRESSINGS) ×4 IMPLANT
COVER SURGICAL LIGHT HANDLE (MISCELLANEOUS) ×2 IMPLANT
COVER WAND RF STERILE (DRAPES) ×2 IMPLANT
CUFF TOURN SGL QUICK 34 (TOURNIQUET CUFF) ×1
CUFF TRNQT CYL 34X4.125X (TOURNIQUET CUFF) ×1 IMPLANT
DECANTER SPIKE VIAL GLASS SM (MISCELLANEOUS) IMPLANT
DRAPE U-SHAPE 47X51 STRL (DRAPES) ×2 IMPLANT
DRSG PAD ABDOMINAL 8X10 ST (GAUZE/BANDAGES/DRESSINGS) ×4 IMPLANT
DURAPREP 26ML APPLICATOR (WOUND CARE) ×2 IMPLANT
ELECT REM PT RETURN 15FT ADLT (MISCELLANEOUS) ×2 IMPLANT
FEMORAL POSTERIOR SZ3 RT (Joint) ×1 IMPLANT
GAUZE SPONGE 4X4 12PLY STRL (GAUZE/BANDAGES/DRESSINGS) ×2 IMPLANT
GAUZE XEROFORM 1X8 LF (GAUZE/BANDAGES/DRESSINGS) IMPLANT
GLOVE BIO SURGEON STRL SZ7.5 (GLOVE) ×2 IMPLANT
GLOVE BIOGEL PI IND STRL 8 (GLOVE) ×2 IMPLANT
GLOVE BIOGEL PI INDICATOR 8 (GLOVE) ×2
GLOVE ECLIPSE 8.0 STRL XLNG CF (GLOVE) ×2 IMPLANT
GOWN STRL REUS W/TWL XL LVL3 (GOWN DISPOSABLE) ×4 IMPLANT
HANDPIECE INTERPULSE COAX TIP (DISPOSABLE) ×1
HOLDER FOLEY CATH W/STRAP (MISCELLANEOUS) ×2 IMPLANT
IMMOBILIZER KNEE 20 (SOFTGOODS) ×2
IMMOBILIZER KNEE 20 THIGH 36 (SOFTGOODS) ×1 IMPLANT
INSERT TIB TRIATH 12 S3 (Insert) ×2 IMPLANT
KIT TURNOVER KIT A (KITS) IMPLANT
KNEE PATELLA ASYMMETRIC 9X29 (Knees) ×2 IMPLANT
KNEE TIBIAL COMPONENT SZ3 (Knees) ×2 IMPLANT
NS IRRIG 1000ML POUR BTL (IV SOLUTION) ×2 IMPLANT
PACK TOTAL KNEE CUSTOM (KITS) ×2 IMPLANT
PADDING CAST COTTON 6X4 STRL (CAST SUPPLIES) ×4 IMPLANT
PENCIL SMOKE EVACUATOR (MISCELLANEOUS) ×2 IMPLANT
PIN FLUTED HEDLESS FIX 3.5X1/8 (PIN) ×2 IMPLANT
POSTERIOR FEMORAL SZ3 RT (Joint) ×2 IMPLANT
PROTECTOR NERVE ULNAR (MISCELLANEOUS) ×2 IMPLANT
SET HNDPC FAN SPRY TIP SCT (DISPOSABLE) ×1 IMPLANT
SET PAD KNEE POSITIONER (MISCELLANEOUS) ×2 IMPLANT
STAPLER VISISTAT 35W (STAPLE) IMPLANT
STRIP CLOSURE SKIN 1/2X4 (GAUZE/BANDAGES/DRESSINGS) ×4 IMPLANT
SUT MNCRL AB 4-0 PS2 18 (SUTURE) ×2 IMPLANT
SUT VIC AB 0 CT1 27 (SUTURE) ×1
SUT VIC AB 0 CT1 27XBRD ANTBC (SUTURE) ×1 IMPLANT
SUT VIC AB 1 CT1 36 (SUTURE) ×4 IMPLANT
SUT VIC AB 2-0 CT1 27 (SUTURE) ×2
SUT VIC AB 2-0 CT1 TAPERPNT 27 (SUTURE) ×2 IMPLANT
TRAY FOLEY MTR SLVR 14FR STAT (SET/KITS/TRAYS/PACK) ×2 IMPLANT
TRAY FOLEY MTR SLVR 16FR STAT (SET/KITS/TRAYS/PACK) IMPLANT
WATER STERILE IRR 1000ML POUR (IV SOLUTION) ×2 IMPLANT
WRAP KNEE MAXI GEL POST OP (GAUZE/BANDAGES/DRESSINGS) ×2 IMPLANT
YANKAUER SUCT BULB TIP 10FT TU (MISCELLANEOUS) ×2 IMPLANT

## 2020-01-17 NOTE — Progress Notes (Signed)
Physical Therapy Treatment Patient Details Name: Jenny Baker MRN: [id number] DOB: 12/27/1960 Today's Date: 01/17/2020    History of Present Illness Pt s/p R TKR  and with hx of vertigo    PT Comments    Pt s/p R TKR and presents with decreased R LE strength/ROM and post op pain limiting functional mobility.  Pt should progress to dc home with family assist.   Follow Up Recommendations  Follow surgeon's recommendation for DC plan and follow-up therapies     Equipment Recommendations  Rolling walker with 5" wheels;Other (comment) (Pt considering 3n1)    Recommendations for Other Services       Precautions / Restrictions Precautions Precautions: Fall;Knee Required Braces or Orthoses: Knee Immobilizer - Right Knee Immobilizer - Right: Discontinue once straight leg raise with < 10 degree lag Restrictions Weight Bearing Restrictions: No Other Position/Activity Restrictions: WBAT    Mobility  Bed Mobility Overal bed mobility: Needs Assistance Bed Mobility: Supine to Sit     Supine to sit: Min assist     General bed mobility comments: cues for sequence and use of L LE to self assist  Transfers Overall transfer level: Needs assistance Equipment used: Rolling walker (2 wheeled) Transfers: Sit to/from Stand Sit to Stand: Min assist         General transfer comment: cues for LE management and use of UEs to self assist  Ambulation/Gait Ambulation/Gait assistance: Min assist Gait Distance (Feet): 100 Feet Assistive device: Rolling walker (2 wheeled) Gait Pattern/deviations: Step-to pattern;Decreased step length - right;Decreased step length - left;Shuffle;Trunk flexed Gait velocity: decr   General Gait Details: cues for sequence, posture and position from [employer]             Wheelchair Mobility    Modified Rankin (Stroke Patients Only)       Balance Overall balance assessment: Mild deficits observed, not formally tested                                           Cognition Arousal/Alertness: Awake/alert Behavior During Therapy: WFL for tasks assessed/performed Overall Cognitive Status: Within Functional Limits for tasks assessed                                        Exercises Total Joint Exercises Ankle Circles/Pumps: AROM;Both;15 reps;Supine Straight Leg Raises: AAROM;Right;5 reps;Supine    General Comments        Pertinent Vitals/Pain Pain Assessment: 0-10 Pain Score: 3  Pain Location: R knee Pain Descriptors / Indicators: Aching;Sore Pain Intervention(s): Limited activity within patient's tolerance;Monitored during session;Premedicated before session;Ice applied    Home Living Family/patient expects to be discharged to:: Private residence Living Arrangements: Spouse/significant other Available Help at Discharge: Family Type of Home: House Home Access: Stairs to enter Entrance Stairs-Rails: Right;Left;Can reach both Home Layout: One level Home Equipment: Crutches      Prior Function Level of Independence: Independent          PT Goals (current goals can now be found in the care plan section) Acute Rehab PT Goals Patient Stated Goal: Regain IND PT Goal Formulation: With patient Time For Goal Achievement: 01/24/20 Potential to Achieve Goals: Good    Frequency    7X/week      PT Plan  Co-evaluation              AM-PAC PT "6 Clicks" Mobility   Outcome Measure  Help needed turning from your back to your side while in a flat bed without using bedrails?: A Little Help needed moving from lying on your back to sitting on the side of a flat bed without using bedrails?: A Little Help needed moving to and from a bed to a chair (including a wheelchair)?: A Little Help needed standing up from a chair using your arms (e.g., wheelchair or bedside chair)?: A Little Help needed to walk in hospital room?: A Little Help needed climbing 3-5 steps with a railing? : A  Lot 6 Click Score: 17    End of Session Equipment Utilized During Treatment: Gait belt;Right knee immobilizer Activity Tolerance: Patient tolerated treatment well Patient left: in chair;with call bell/phone within reach;with chair alarm set;with family/visitor present Nurse Communication: Mobility status PT Visit Diagnosis: Unsteadiness on feet (R26.81);Difficulty in walking, not elsewhere classified (R26.2)     Time: 0623-7628 PT Time Calculation (min) (ACUTE ONLY): 25 min  Charges:  $Gait Training: 8-22 mins                     [address] Pager [phone] Office [phone]    Hadrian Yarbrough 01/17/2020, 5:30 PM

## 2020-01-17 NOTE — Op Note (Signed)
Jenny Baker, Jenny Baker MEDICAL RECORD GQ:[id number] ACCOUNT [id number] DATE OF BIRTH:May 09, 1961 FACILITY: [facility] LOCATION: [id number] PHYSICIAN:Jenny Baker Jenny Fort, MD  OPERATIVE REPORT  DATE OF PROCEDURE:  01/17/2020  PREOPERATIVE DIAGNOSIS:  Primary osteoarthritis and degenerative joint disease, right knee.  POSTOPERATIVE DIAGNOSIS:  Primary osteoarthritis and degenerative joint disease, right knee.  PROCEDURE:  Right total knee arthroplasty.  IMPLANTS:  Stryker Triathlon press-fit knee system with size 3 femur, size 3 tibial tray, 12 mm fixed bearing polyethylene insert, size 29 press-fit patellar button.  SURGEON:  Jenny Baker. Jenny Linden, MD  ASSISTANT:  Jenny Emery, PA-C  ANESTHESIA: 1.  Right lower extremity adductor canal block. 2.  Spinal. 3.  With 0.25% plain Marcaine.  ANTIBIOTICS:  Two grams IV Ancef.  ESTIMATED BLOOD LOSS:  Less than 100 mL.  TOURNIQUET TIME:  Less than 1 hour.  COMPLICATIONS:  None.  INDICATIONS:  Jenny Baker is a 59 year old active runner well known to me.  She has been dealing with right knee pain for many years.  It was about 2 years ago, we performed arthroscopic intervention of the right knee and did find some areas of full-thickness  cartilage loss at the patellofemoral joint as well as the medial compartment of the knee.  She has tried and failed all forms of conservative treatment at this point.  This includes multiple steroid injections and hyaluronic acid for the right knee.  At  this point, her pain is daily and is detrimentally affecting her mobility, her quality of life, and activities of daily living to the point, she does wish to proceed with a total knee arthroplasty.  We talked in length about the risk of acute blood loss  anemia, nerve or vessel injury, fracture, infection, DVT, and implant failure.  We talked about our goals being decreased pain, improve mobility and overall improve quality of life.  DESCRIPTION OF PROCEDURE:  After  informed consent was obtained and appropriate right knee was marked an adductor canal block was obtained in the right lower extremity in the holding room.  She was then brought to the operating room and sat up on the  operating table.  Spinal anesthesia was obtained.  She was then laid in supine position on the operating table.  Foley catheter was placed and nonsterile tourniquet was placed around her upper right thigh.  Her right thigh, knee, leg, and ankle were  prepped and draped with DuraPrep and sterile drapes including a sterile stockinette.  Time-out was called.  She was identified as correct patient, the right knee.  I then used an Esmarch to wrap that leg and tourniquet was inflated to 300 mm of pressure.   I then made a direct midline incision over the patella and carried this proximally and distally.  We dissected down the knee joint carried out and carried out a medial parapatellar arthrotomy finding a moderate joint effusion and significant cartilage  wear in the medial and patellofemoral compartments of the right knee.  We removed remnants of ACL, PCL, medial and lateral meniscus.  With the knee in a flexed position, we used our extramedullary cutting guide for making our tibia cut, correcting for  varus and valgus and neutral slope.  We made this cut without difficulty, taking 9 mm off the high side.  We then used an intramedullary drill into the notch of the femur preparing our distal femoral cut in alignment guide, setting this for right knee at  5 degrees externally rotated for an 8 mm distal femoral cut.  We made this  cut without difficulty as well.  We then backed down to having the knee in full extension and removed the remnants of the meniscus from the back of the knee.  We placed a 9 mm  extension block and she is slightly hyperextended.  We then went back to the femur and put our femoral sizing guide based off the epicondylar axis and [place] line.  Based off this, we chose a size 3  femur.  We put a 4-in-1 cutting block for a size 3  femur, made our anterior and posterior cuts, followed by our chamfer cuts.  We then made our femoral box cut.  Attention was then turned back to the tibia.  We chose a size 3 tibia tray for coverage setting the rotation off the tibial tubercle and the  femur.  We made our keel punch off of this.  With the size 3 trial tibia and the size 3 trial right femur we trialed a 9 and then 11 mm fixed bearing polyethylene insert.  We felt like the real insert would need to be a 12.  We then made our patellar cut  and drilled 3 holes for a size 29 press-fit patellar button.  With all trial instrumentation in the knee, I put the knee through range of motion and I was pleased with stability and range of motion of the knee.  We then removed all trial components from  the knee and irrigated the knee with normal saline solution using pulsatile lavage.  I then dried the knee real well and placed an intraarticular injection of Marcaine around the arthrotomy.  We then dried the knee again and placed our real press-fit  Stryker Triathlon tibial tray size 3 followed by our size 3 press-fit right femur.  We then press-fit our 29 patellar button and placed a real 12 mm fixed bearing polyethylene insert.  I then put the knee through range of motion.  I was pleased with  stability.  We then closed the arthrotomy with interrupted #1 Vicryl suture.  We did dial the tourniquet down before closing this and the hemostasis obtained with electrocautery.  The deep tissue was closed with 0 Vicryl followed by 2-0 Vicryl to close  the subcutaneous tissue and 4-0 Monocryl subcuticular stitch was applied.  Steri-Strips were placed and a well-padded sterile dressing.  The patient was taken to recovery room in stable condition.  All final counts were correct.  There were no  complications noted.  Of note, Jenny Stabile, PA-C, assisted during the entire case and his assistance was crucial for  facilitating all aspects of this case.  CN/NUANCE  D:01/17/2020 T:01/17/2020 JOB:[id number]

## 2020-01-17 NOTE — Anesthesia Procedure Notes (Signed)
Anesthesia Regional Block: Adductor canal block   Pre-Anesthetic Checklist: ,, timeout performed, Correct Patient, Correct Site, Correct Laterality, Correct Procedure, Correct Position, site marked, Risks and benefits discussed,  Surgical consent,  Pre-op evaluation,  At surgeon's request and post-op pain management  Laterality: Right  Prep: chloraprep       Needles:  Injection technique: Single-shot  Needle Type: Echogenic Stimulator Needle     Needle Length: 10cm  Needle Gauge: 20     Additional Needles:   Procedures:,,,, ultrasound used (permanent image in chart),,,,  Narrative:  Start time: 01/17/2020 7:00 AM End time: 01/17/2020 7:06 AM Injection made incrementally with aspirations every 5 mL.  Performed by: Personally  Anesthesiologist: Lidia Collum, MD  Additional Notes: Standard monitors applied. Skin prepped. Good needle visualization with ultrasound. Injection made in 5cc increments with no resistance to injection. Patient tolerated the procedure well.

## 2020-01-17 NOTE — Progress Notes (Signed)
Orthopedic Tech Progress Note Patient Details:  Jenny Baker [birth year] [id number]  Patient ID: [name], female   DOB: Sep 16, 1960, 59 y.o.   MRN: [id number]   Kennis Carina 01/17/2020, 2:36 PM cpm removed @1435 

## 2020-01-17 NOTE — Brief Op Note (Signed)
01/17/2020  8:33 AM  PATIENT:  Jenny Baker  59 y.o. female  PRE-OPERATIVE DIAGNOSIS:  right knee osteoarthritis  POST-OPERATIVE DIAGNOSIS:  right knee osteoarthritis  PROCEDURE:  Procedure(s): RIGHT TOTAL KNEE ARTHROPLASTY (Right)  SURGEON:  Surgeon(s) and Role:    Mcarthur Rossetti, MD - Primary  PHYSICIAN ASSISTANT:  Benita Stabile, PA-C  ANESTHESIA:   local, regional and spinal  EBL:  100 mL   COUNTS:  YES  TOURNIQUET:   Total Tourniquet Time Documented: Thigh (Right) - 37 minutes Total: Thigh (Right) - 37 minutes   DICTATION: .Other Dictation: Dictation Number [id number]  PLAN OF CARE: Admit for overnight observation  PATIENT DISPOSITION:  PACU - hemodynamically stable.   Delay start of Pharmacological VTE agent (>24hrs) due to surgical blood loss or risk of bleeding: no

## 2020-01-17 NOTE — Anesthesia Procedure Notes (Addendum)
Spinal  Patient location during procedure: OR Staffing Performed: anesthesiologist  Anesthesiologist: Lidia Collum, MD Preanesthetic Checklist Completed: patient identified, IV checked, site marked, risks and benefits discussed, surgical consent, monitors and equipment checked, pre-op evaluation and timeout performed Spinal Block Patient position: sitting Prep: DuraPrep and site prepped and draped Patient monitoring: heart rate, cardiac monitor, continuous pulse ox and blood pressure Approach: midline Location: L3-4 Injection technique: single-shot Needle Needle type: Pencan  Needle gauge: 24 G Needle length: 9 cm Assessment Sensory level: T4 Additional Notes IV functioning, monitors applied to pt. Expiration date of kit checked and confirmed to be in date. Sterile prep and drape, hand hygiene and sterile gloved used. Pt was positioned and spine was prepped in sterile fashion. Skin was anesthetized with lidocaine. Attempt x 1 by CRNA unsuccessful.  Attempt by MDA Free flow of clear CSF obtained prior to injecting local anesthetic into CSF x 1 attempt. Spinal needle aspirated freely following injection. Needle was carefully withdrawn, and pt tolerated procedure well. Loss of motor and sensory on exam post injection.

## 2020-01-17 NOTE — Transfer of Care (Signed)
Immediate Anesthesia Transfer of Care Note  Patient: Ximenna Louderback  Procedure(s) Performed: RIGHT TOTAL KNEE ARTHROPLASTY (Right Knee)  Patient Location: PACU  Anesthesia Type:Spinal  Level of Consciousness: awake, alert  and oriented  Airway & Oxygen Therapy: Patient Spontanous Breathing and Patient connected to face mask oxygen  Post-op Assessment: Report given to RN and Post -op Vital signs reviewed and stable  Post vital signs: Reviewed and stable  Last Vitals:  Vitals Value Taken Time  BP 93/79 01/17/20 0900  Temp    Pulse 73 01/17/20 0908  Resp 16 01/17/20 0908  SpO2 100 % 01/17/20 0908  Vitals shown include unvalidated device data.  Last Pain:  Vitals:   01/17/20 0629  TempSrc: Oral  PainSc:       Patients Stated Pain Goal: 4 (07/46/00 2984)  Complications: No complications documented.

## 2020-01-17 NOTE — Anesthesia Postprocedure Evaluation (Signed)
Anesthesia Post Note  Patient: Jenny Baker  Procedure(s) Performed: RIGHT TOTAL KNEE ARTHROPLASTY (Right Knee)     Patient location during evaluation: PACU Anesthesia Type: Spinal Level of consciousness: oriented and awake and alert Pain management: pain level controlled Vital Signs Assessment: post-procedure vital signs reviewed and stable Respiratory status: spontaneous breathing, respiratory function stable and nonlabored ventilation Cardiovascular status: blood pressure returned to baseline and stable Postop Assessment: no headache, no backache, no apparent nausea or vomiting and spinal receding Anesthetic complications: no   No complications documented.  Last Vitals:  Vitals:   01/17/20 1000 01/17/20 1021  BP:  (!) 108/57  Pulse:  61  Resp:  15  Temp: 36.4 C (!) 36.4 C  SpO2:  100%    Last Pain:  Vitals:   01/17/20 1021  TempSrc: Oral  PainSc:                  Lidia Collum

## 2020-01-17 NOTE — Interval H&P Note (Signed)
History and Physical Interval Note: The patient understands fully that we are proceeding with a right total knee arthroplasty to treat the osteoarthritis of the right knee.  There has been no acute change in her medical status.  See previous H&P.  The risk and benefits of surgery have been discussed in detail and informed consent is obtained.  The right knee has been marked.  01/17/2020 6:49 AM  Jenny Baker Sartorius  has presented today for surgery, with the diagnosis of right knee osteoarthritis.  The various methods of treatment have been discussed with the patient and family. After consideration of risks, benefits and other options for treatment, the patient has consented to  Procedure(s): RIGHT TOTAL KNEE ARTHROPLASTY (Right) as a surgical intervention.  The patient's history has been reviewed, patient examined, no change in status, stable for surgery.  I have reviewed the patient's chart and labs.  Questions were answered to the patient's satisfaction.     Mcarthur Rossetti

## 2020-01-18 DIAGNOSIS — M1711 Unilateral primary osteoarthritis, right knee: Secondary | ICD-10-CM | POA: Diagnosis not present

## 2020-01-18 LAB — CBC
HCT: 30.1 % — ABNORMAL LOW (ref 36.0–46.0)
Hemoglobin: 10 g/dL — ABNORMAL LOW (ref 12.0–15.0)
MCH: 30.1 pg (ref 26.0–34.0)
MCHC: 33.2 g/dL (ref 30.0–36.0)
MCV: 90.7 fL (ref 80.0–100.0)
Platelets: 177 10*3/uL (ref 150–400)
RBC: 3.32 MIL/uL — ABNORMAL LOW (ref 3.87–5.11)
RDW: 12.3 % (ref 11.5–15.5)
WBC: 9.8 10*3/uL (ref 4.0–10.5)
nRBC: 0 % (ref 0.0–0.2)

## 2020-01-18 MED ORDER — ASPIRIN 81 MG PO CHEW: 81.0000 mg | CHEWABLE_TABLET | Freq: Two times a day (BID) | ORAL | 0 refills | Status: AC

## 2020-01-18 MED ORDER — HYDROCODONE-ACETAMINOPHEN 5-325 MG PO TABS: 1.0000 | ORAL_TABLET | Freq: Four times a day (QID) | ORAL | 0 refills | Status: AC | PRN

## 2020-01-18 MED ORDER — METHOCARBAMOL 500 MG PO TABS: 500.0000 mg | ORAL_TABLET | Freq: Four times a day (QID) | ORAL | 1 refills | Status: AC | PRN

## 2020-01-18 MED ORDER — SODIUM CHLORIDE 0.9 % IV BOLUS
500.0000 mL | Freq: Once | INTRAVENOUS | Status: AC
  Administered 2020-01-18: 500 mL via INTRAVENOUS

## 2020-01-18 MED ORDER — HYDROCODONE-ACETAMINOPHEN 5-325 MG PO TABS: 1.0000 | ORAL_TABLET | ORAL | 0 refills | Status: AC | PRN

## 2020-01-18 MED ORDER — HYDROCODONE-ACETAMINOPHEN 5-325 MG PO TABS
1.0000 | ORAL_TABLET | ORAL | Status: DC | PRN
  Administered 2020-01-18 – 2020-01-19 (×4): 1 via ORAL
  Filled 2020-01-18 (×4): qty 1

## 2020-01-18 NOTE — Progress Notes (Addendum)
Subjective: 1 Day Post-Op Procedure(s) (LRB): RIGHT TOTAL KNEE ARTHROPLASTY (Right) Patient reports pain as mild.  Possible vasovagal this AM.    Objective: Vital signs in last 24 hours: Temp:  [97.8 F (36.6 C)-98.8 F (37.1 C)] 98.5 F (36.9 C) (06/26 0623) Pulse Rate:  [64-87] 72 (06/26 0937) Resp:  [10-16] 16 (06/26 0623) BP: (105-130)/(48-67) 113/55 (06/26 0937) SpO2:  [96 %-100 %] 98 % (06/26 0937)  Intake/Output from previous day: 06/25 0701 - 06/26 0700 In: 3564.9 [P.O.:480; I.V.:2834.9; IV Piggyback:250] Out: 4900 [Urine:4800; Blood:100] Intake/Output this shift: Total I/O In: 240 [P.O.:240] Out: -   Recent Labs    01/18/20 0336  HGB 10.0*   Recent Labs    01/18/20 0336  WBC 9.8  RBC 3.32*  HCT 30.1*  PLT 177   No results for input(s): NA, K, CL, CO2, BUN, CREATININE, GLUCOSE, CALCIUM in the last 72 hours. No results for input(s): LABPT, INR in the last 72 hours.  General: No acute distress. Awake and alert.   Right lower extremity: Sensation intact distally Intact pulses distally Dorsiflexion/Plantar flexion intact Incision: scant drainage Compartment soft   Assessment/Plan: 1 Day Post-Op Procedure(s) (LRB): RIGHT TOTAL KNEE ARTHROPLASTY (Right) Up with therapy  Will discontinue OxyContin,  vasovagal this AM after taking medication.  Possible discharge to home later today if remains stable.      Averie Hornbaker 01/18/2020, 10:34 AM

## 2020-01-18 NOTE — Discharge Instructions (Signed)
Dental Antibiotics:  In most cases prophylactic antibiotics for Dental procdeures after total joint surgery are not necessary.  Exceptions are as follows:  1. History of prior total joint infection  2. Severely immunocompromised (Organ Transplant, cancer chemotherapy, Rheumatoid biologic meds such as [place])  3. Poorly controlled diabetes (A1C &gt; 8.0, blood glucose over 200)  If you have one of these conditions, contact your surgeon for an antibiotic prescription, prior to your dental procedure.tomorrow INSTRUCTIONS AFTER JOINT REPLACEMENT   o Remove items at home which could result in a fall. This includes throw rugs or furniture in walking pathways o ICE to the affected joint every three hours while awake for 30 minutes at a time, for at least the first 3-5 days, and then as needed for pain and swelling.  Continue to use ice for pain and swelling. You may notice swelling that will progress down to the foot and ankle.  This is normal after surgery.  Elevate your leg when you are not up walking on it.   o Continue to use the breathing machine you got in the hospital (incentive spirometer) which will help keep your temperature down.  It is common for your temperature to cycle up and down following surgery, especially at night when you are not up moving around and exerting yourself.  The breathing machine keeps your lungs expanded and your temperature down.   DIET:  As you were doing prior to hospitalization, we recommend a well-balanced diet.  DRESSING / WOUND CARE / SHOWERING  Keep the surgical dressing until follow up.  The dressing is water proof, so you can shower without any extra covering.  IF THE DRESSING FALLS OFF or the wound gets wet inside, change the dressing with sterile gauze.  Please use good hand washing techniques before changing the dressing.  Do not use any lotions or creams on the incision until instructed by your surgeon.    ACTIVITY  o Increase activity slowly as  tolerated, but follow the weight bearing instructions below.   o No driving for 6 weeks or until further direction given by your physician.  You cannot drive while taking narcotics.  o No lifting or carrying greater than 10 lbs. until further directed by your surgeon. o Avoid periods of inactivity such as sitting longer than an hour when not asleep. This helps prevent blood clots.  o You may return to work once you are authorized by your doctor.     WEIGHT BEARING   Weight bearing as tolerated with assist device (walker, cane, etc) as directed, use it as long as suggested by your surgeon or therapist, typically at least 4-6 weeks.   EXERCISES  Results after joint replacement surgery are often greatly improved when you follow the exercise, range of motion and muscle strengthening exercises prescribed by your doctor. Safety measures are also important to protect the joint from further injury. Any time any of these exercises cause you to have increased pain or swelling, decrease what you are doing until you are comfortable again and then slowly increase them. If you have problems or questions, call your caregiver or physical therapist for advice.   Rehabilitation is important following a joint replacement. After just a few days of immobilization, the muscles of the leg can become weakened and shrink (atrophy).  These exercises are designed to build up the tone and strength of the thigh and leg muscles and to improve motion. Often times heat used for twenty to thirty minutes before working out  will loosen up your tissues and help with improving the range of motion but do not use heat for the first two weeks following surgery (sometimes heat can increase post-operative swelling).   These exercises can be done on a training (exercise) mat, on the floor, on a table or on a bed. Use whatever works the best and is most comfortable for you.    Use music or television while you are exercising so that the  exercises are a pleasant break in your day. This will make your life better with the exercises acting as a break in your routine that you can look forward to.   Perform all exercises about fifteen times, three times per day or as directed.  You should exercise both the operative leg and the other leg as well.  Exercises include:   . Quad Sets - Tighten up the muscle on the front of the thigh (Quad) and hold for 5-10 seconds.   . Straight Leg Raises - With your knee straight (if you were given a brace, keep it on), lift the leg to 60 degrees, hold for 3 seconds, and slowly lower the leg.  Perform this exercise against resistance later as your leg gets stronger.  . Leg Slides: Lying on your back, slowly slide your foot toward your buttocks, bending your knee up off the floor (only go as far as is comfortable). Then slowly slide your foot back down until your leg is flat on the floor again.  [place] Haring Wings: Lying on your back spread your legs to the side as far apart as you can without causing discomfort.  . Hamstring Strength:  Lying on your back, push your heel against the floor with your leg straight by tightening up the muscles of your buttocks.  Repeat, but this time bend your knee to a comfortable angle, and push your heel against the floor.  You may put a pillow under the heel to make it more comfortable if necessary.   A rehabilitation program following joint replacement surgery can speed recovery and prevent re-injury in the future due to weakened muscles. Contact your doctor or a physical therapist for more information on knee rehabilitation.    CONSTIPATION  Constipation is defined medically as fewer than three stools per week and severe constipation as less than one stool per week.  Even if you have a regular bowel pattern at home, your normal regimen is likely to be disrupted due to multiple reasons following surgery.  Combination of anesthesia, postoperative narcotics, change in appetite and  fluid intake all can affect your bowels.   YOU MUST use at least one of the following options; they are listed in order of increasing strength to get the job done.  They are all available over the counter, and you may need to use some, POSSIBLY even all of these options:    Drink plenty of fluids (prune juice may be helpful) and high fiber foods Colace 100 mg by mouth twice a day  Senokot for constipation as directed and as needed Dulcolax (bisacodyl), take with full glass of water  Miralax (polyethylene glycol) once or twice a day as needed.  If you have tried all these things and are unable to have a bowel movement in the first 3-4 days after surgery call either your surgeon or your primary doctor.    If you experience loose stools or diarrhea, hold the medications until you stool forms back up.  If your symptoms do not get better  within 1 week or if they get worse, check with your doctor.  If you experience "the worst abdominal pain ever" or develop nausea or vomiting, please contact the office immediately for further recommendations for treatment.   ITCHING:  If you experience itching with your medications, try taking only a single pain pill, or even half a pain pill at a time.  You can also use Benadryl over the counter for itching or also to help with sleep.   TED HOSE STOCKINGS:  Use stockings on both legs until for at least 2 weeks or as directed by physician office. They may be removed at night for sleeping.  MEDICATIONS:  See your medication summary on the "After Visit Summary" that nursing will review with you.  You may have some home medications which will be placed on hold until you complete the course of blood thinner medication.  It is important for you to complete the blood thinner medication as prescribed.  PRECAUTIONS:  If you experience chest pain or shortness of breath - call 911 immediately for transfer to the hospital emergency department.   If you develop a fever greater  that 101 F, purulent drainage from wound, increased redness or drainage from wound, foul odor from the wound/dressing, or calf pain - CONTACT YOUR SURGEON.                                                   FOLLOW-UP APPOINTMENTS:  If you do not already have a post-op appointment, please call the office for an appointment to be seen by your surgeon.  Guidelines for how soon to be seen are listed in your "After Visit Summary", but are typically between 1-4 weeks after surgery.  OTHER INSTRUCTIONS:   Knee Replacement:  Do not place pillow under knee, focus on keeping the knee straight while resting. CPM instructions: 0-90 degrees, 2 hours in the morning, 2 hours in the afternoon, and 2 hours in the evening. Place foam block, curve side up under heel at all times except when in CPM or when walking.  DO NOT modify, tear, cut, or change the foam block in any way.   DENTAL ANTIBIOTICS:  In most cases prophylactic antibiotics for Dental procdeures after total joint surgery are not necessary.  Exceptions are as follows:  1. History of prior total joint infection  2. Severely immunocompromised (Organ Transplant, cancer chemotherapy, Rheumatoid biologic meds such as [place])  3. Poorly controlled diabetes (A1C &gt; 8.0, blood glucose over 200)  If you have one of these conditions, contact your surgeon for an antibiotic prescription, prior to your dental procedure.   MAKE SURE YOU:  . Understand these instructions.  . Get help right away if you are not doing well or get worse.    Thank you for letting us be a part of your medical care team.  It is a privilege we respect greatly.  We hope these instructions will help you stay on track for a fast and full recovery!

## 2020-01-18 NOTE — Progress Notes (Signed)
Patient slumped over when physical therapy entered the room. Nauseated for a few seconds, and eyes moving from side to side. Vital signs obtained by this nurse with a focused assessment. Speech clear and nausea subsided. RR even symmetrical and non labored. Spo2 98% on room air. Patient says that she is somewhat dizzy and fluids are restarted. All Vital signs within normal limits. Bed alarm is on and call bell within reach.

## 2020-01-18 NOTE — Progress Notes (Signed)
01/18/20 1600  PT Visit Information  Last PT Received On 01/18/20  RN changed pain med regimen, gave fluid bolus, BP improved and pt reports feeling better overall. Willing to try OOB amb, did very well amb ~ 100', no dizziness. Husband followed with chair for safety.  Encouraged OOB in chair for a few hrs, pt agreeable. Will continue PT POC  Assistance Needed +1  History of Present Illness Pt s/p R TKR  and with hx of vertigo  Subjective Data  Patient Stated Goal Regain IND  Precautions  Precautions Fall;Knee  Required Braces or Orthoses Knee Immobilizer - Right  Knee Immobilizer - Right Discontinue once straight leg raise with < 10 degree lag  Restrictions  Other Position/Activity Restrictions WBAT  Pain Assessment  Pain Assessment 0-10  Pain Score 3  Pain Location R knee  Pain Descriptors / Indicators Aching;Sore  Pain Intervention(s) Limited activity within patient's tolerance;Monitored during session;Premedicated before session;Repositioned  Cognition  Arousal/Alertness Awake/alert  Behavior During Therapy WFL for tasks assessed/performed  Overall Cognitive Status Within Functional Limits for tasks assessed  Bed Mobility  Overal bed mobility Needs Assistance  Bed Mobility Supine to Sit  Supine to sit Min guard  General bed mobility comments cues for sequence and use of L LE to self assist  Transfers  Overall transfer level Needs assistance  Equipment used Rolling walker (2 wheeled)  Transfers Sit to/from Stand  Sit to Stand Min assist  General transfer comment cues for LE management and use of UEs to self assist  Ambulation/Gait  Ambulation/Gait assistance Min assist  Gait Distance (Feet) 100 Feet  Assistive device Rolling walker (2 wheeled)  Gait Pattern/deviations Step-to pattern;Decreased step length - right;Decreased step length - left;Shuffle;Trunk flexed  General Gait Details cues for sequence, posture and position from RW (chair follow for safety )  Gait  velocity decr  PT - End of Session  Equipment Utilized During Treatment Right knee immobilizer;Gait belt  Activity Tolerance Patient tolerated treatment well  Patient left in chair;with call bell/phone within reach;with chair alarm set  Nurse Communication Mobility status   PT - Assessment/Plan  PT Plan Current plan remains appropriate  PT Visit Diagnosis Difficulty in walking, not elsewhere classified (R26.2);Muscle weakness (generalized) (M62.81)  PT Frequency (ACUTE ONLY) 7X/week  Follow Up Recommendations Follow surgeon's recommendation for DC plan and follow-up therapies  PT equipment Rolling walker with 5" wheels;3in1 (PT)  AM-PAC PT "6 Clicks" Mobility Outcome Measure (Version 2)  Help needed turning from your back to your side while in a flat bed without using bedrails? 3  Help needed moving from lying on your back to sitting on the side of a flat bed without using bedrails? 3  Help needed moving to and from a bed to a chair (including a wheelchair)? 3  Help needed standing up from a chair using your arms (e.g., wheelchair or bedside chair)? 3  Help needed to walk in hospital room? 3  Help needed climbing 3-5 steps with a railing?  2  6 Click Score 17  Consider Recommendation of Discharge To: Home with [facility]  PT Goal Progression  Progress towards PT goals Progressing toward goals  Acute Rehab PT Goals  PT Goal Formulation With patient  Time For Goal Achievement 01/24/20  Potential to Achieve Goals Good  PT Time Calculation  PT Start Time (ACUTE ONLY) 1540  PT Stop Time (ACUTE ONLY) 1556  PT Time Calculation (min) (ACUTE ONLY) 16 min  PT General Charges  $$ ACUTE PT  VISIT 1 Visit  PT Treatments  $Gait Training 8-22 mins

## 2020-01-18 NOTE — Progress Notes (Signed)
Dr. Ninfa Linden paged at 1:25 after getting patient settled- responded at 1335 order for 500 bolus obtained- bolus administered via iv pump at this time.

## 2020-01-18 NOTE — Progress Notes (Signed)
Physical Therapy Treatment Patient Details Name: Jenny Baker MRN: [id number] DOB: Feb 14, 1961 Today's Date: 01/18/2020    History of Present Illness Pt s/p R TKR  and with hx of vertigo    PT Comments    Pt received in bed and educated on exercises (pt did not move LEs over EOB or mobilize). Pt performing knee flexion and reports increased pain and then nausea.  Pt felt emesis was coming so provided basin and left room to get washcloths (for cooling) and emesis bag.  Upon quick return to room, pt with upper body slumped over to right side in bed.  Upon returning pt to neutral, eyes opened and left and right quick eye movements observed.  Pt then awakened and questioning what occurred.  Emergency button utilized so RN came in to room to quickly assess however pt was already awake at the time.  Pt left with RN.   Follow Up Recommendations  Follow surgeon's recommendation for DC plan and follow-up therapies     Equipment Recommendations  Rolling walker with 5" wheels;3in1 (PT)    Recommendations for Other Services       Precautions / Restrictions Precautions Precautions: Fall;Knee Required Braces or Orthoses: Knee Immobilizer - Right Knee Immobilizer - Right: Discontinue once straight leg raise with < 10 degree lag Restrictions Other Position/Activity Restrictions: WBAT    Mobility  Bed Mobility                  Transfers                    Ambulation/Gait                 Stairs             Wheelchair Mobility    Modified Rankin (Stroke Patients Only)       Balance                                            Cognition Arousal/Alertness: Awake/alert Behavior During Therapy: WFL for tasks assessed/performed Overall Cognitive Status: Within Functional Limits for tasks assessed                                        Exercises Total Joint Exercises Ankle Circles/Pumps: AROM;Both;10 reps Quad Sets:  AROM;Right;10 reps Heel Slides: AAROM;Right;5 reps Hip ABduction/ADduction: Right;10 reps;AAROM    General Comments        Pertinent Vitals/Pain Pain Assessment: 0-10 Pain Score: 5  Pain Location: R knee Pain Descriptors / Indicators: Aching;Sore Pain Intervention(s): Repositioned;Premedicated before session;Monitored during session;Ice applied    Home Living                      Prior Function            PT Goals (current goals can now be found in the care plan section) Progress towards PT goals: Progressing toward goals    Frequency    7X/week      PT Plan Current plan remains appropriate    Co-evaluation              AM-PAC PT "6 Clicks" Mobility   Outcome Measure  Help needed turning from your back to your side while in a flat bed without  using bedrails?: A Little Help needed moving from lying on your back to sitting on the side of a flat bed without using bedrails?: A Little Help needed moving to and from a bed to a chair (including a wheelchair)?: A Little Help needed standing up from a chair using your arms (e.g., wheelchair or bedside chair)?: A Little Help needed to walk in hospital room?: A Lot Help needed climbing 3-5 steps with a railing? : A Lot 6 Click Score: 16    End of Session   Activity Tolerance: Treatment limited secondary to medical complications (Comment) Patient left: in bed;with call bell/phone within reach;with bed alarm set;with nursing/sitter in room Nurse Communication: Mobility status PT Visit Diagnosis: Difficulty in walking, not elsewhere classified (R26.2);Muscle weakness (generalized) (M62.81)     Time: 5301-0404 PT Time Calculation (min) (ACUTE ONLY): 17 min  Charges:  $Therapeutic Exercise: 8-22 mins                     Arlyce Dice, DPT Acute Rehabilitation Services Pager: [phone] Office: [phone]  York Ram E 01/18/2020, 2:09 PM

## 2020-01-18 NOTE — Plan of Care (Signed)
  Problem: Health Behavior/Discharge Planning: Goal: Ability to manage health-related needs will improve Outcome: Progressing   Problem: Activity: Goal: Risk for activity intolerance will decrease Outcome: Progressing   Problem: Pain Managment: Goal: General experience of comfort will improve Outcome: Progressing   

## 2020-01-18 NOTE — Progress Notes (Signed)
At 1300 Patient assisted to bathroom with this nurse and Amber M. NT. Patient gait stable and even with rolling walker. Halfway to the restroom patient says "I don't feel very well". Got patient to restroom and patient blacked out. pupils dilated and fixed then moving rapidly. This nurse and jennifer talyor, RN were able to gery patient to focus. Vitals obtained and  and charted

## 2020-01-18 NOTE — TOC Initial Note (Signed)
Transition of Care [facility]) - Initial/Assessment Note    Patient Details  Name: Nilah Belcourt MRN: [id number] Date of Birth: 1961-01-26  Transition of Care (TOC) CM/SW Contact:    Joaquin Courts, RN Phone Number: 01/18/2020, 2:25 PM  Clinical Narrative:         CM spoke with patient at bedside.  CM is unable to arrange HHPT after contacting all area agencies.  Declines obtained due to agencies being out of network with insurance or lack of staffing to provide services. MD was notified of this. CM unable to schedule OPPT due to weekend and offices are closed. Pt will need scheduling assistance from MD office on Monday.  [facility] given referral for RW.              Expected Discharge Plan: Home/Self Care Barriers to Discharge: No [facility] will accept this patient   Patient Goals and CMS Choice Patient states their goals for this hospitalization and ongoing recovery are:: to go home CMS Medicare.gov Compare Post Acute Care list provided to:: Patient Choice offered to / list presented to : Patient  Expected Discharge Plan and Services Expected Discharge Plan: Home/Self Care   Discharge Planning Services: CM Consult Post Acute Care Choice: [place] arrangements for the past 2 months: Single Family Home Expected Discharge Date: 01/18/20               DME Arranged: Gilford Rile rolling DME Agency: [employer] Date DME Agency Contacted: 01/18/20 Time DME Agency Contacted: [phone] Representative spoke with at DME Agency: [place]            Prior Living Arrangements/Services Living arrangements for the past 2 months: [place]   Patient language and need for interpreter reviewed:: Yes Do you feel safe going back to the place where you live?: Yes      Need for Family Participation in Patient Care: Yes (Comment) Care giver support system in place?: Yes (comment)   Criminal Activity/Legal Involvement Pertinent to Current Situation/Hospitalization: No - Comment as  needed  Activities of Daily Living Home Assistive Devices/Equipment: Crutches, Grab bars in shower, Hand-held shower hose, Contact lenses ADL Screening (condition at time of admission) Patient's cognitive ability adequate to safely complete daily activities?: Yes Is the patient deaf or have difficulty hearing?: No Does the patient have difficulty seeing, even when wearing glasses/contacts?: No Does the patient have difficulty concentrating, remembering, or making decisions?: No Patient able to express need for assistance with ADLs?: Yes Does the patient have difficulty dressing or bathing?: No Independently performs ADLs?: Yes (appropriate for developmental age) Does the patient have difficulty walking or climbing stairs?: No Weakness of Legs: Right Weakness of Arms/Hands: None  Permission Sought/Granted                  Emotional Assessment Appearance:: Appears stated age Attitude/Demeanor/Rapport: Engaged Affect (typically observed): Accepting Orientation: : Oriented to Self, Oriented to Place, Oriented to  Time, Oriented to Situation   Psych Involvement: No (comment)  Admission diagnosis:  Status post total right knee replacement [Z96.651] Patient Active Problem List   Diagnosis Date Noted  . Status post total right knee replacement 01/17/2020  . Unilateral primary osteoarthritis, right knee 11/18/2019  . Basal cell carcinoma of left cheek 02/08/2019  . Changing skin lesion 12/21/2018  . History of basal cell carcinoma (BCC) 12/21/2018  . Chronic pain of right knee 09/06/2017   PCP:  Lynder Parents., MD Pharmacy:   [phone] -  [place], [place] [address] Phone: [phone] Fax: [phone]     Social Determinants of Health (SDOH) Interventions    Readmission Risk Interventions No flowsheet data found.

## 2020-01-18 NOTE — Progress Notes (Signed)
Physical Therapy Treatment Patient Details Name: Jenny Baker MRN: [id number] DOB: 1961/02/02 Today's Date: 01/18/2020    History of Present Illness Pt s/p R TKR  and with hx of vertigo    PT Comments    RN reports another syncope type episode and pt currently receiving bolus.  Pt educated on exercises (had not yet completed this morning).  Will attempt to check back this afternoon to see if pt is able to mobilize.   Follow Up Recommendations  Follow surgeon's recommendation for DC plan and follow-up therapies     Equipment Recommendations  Rolling walker with 5" wheels;3in1 (PT)    Recommendations for Other Services       Precautions / Restrictions Precautions Precautions: Fall;Knee Required Braces or Orthoses: Knee Immobilizer - Right Knee Immobilizer - Right: Discontinue once straight leg raise with < 10 degree lag Restrictions Other Position/Activity Restrictions: WBAT    Mobility  Bed Mobility                  Transfers                    Ambulation/Gait                 Stairs             Wheelchair Mobility    Modified Rankin (Stroke Patients Only)       Balance                                            Cognition Arousal/Alertness: Awake/alert Behavior During Therapy: WFL for tasks assessed/performed Overall Cognitive Status: Within Functional Limits for tasks assessed                                        Exercises Total Joint Exercises Ankle Circles/Pumps: AROM;Both;10 reps Quad Sets: AROM;Right;10 reps Towel Squeeze: AROM;Both;10 reps Short Arc QuadSinclair Ship;Right;10 reps Heel Slides: AAROM;Right;10 reps Hip ABduction/ADduction: Right;10 reps;AAROM Straight Leg Raises: AAROM;Right;10 reps    General Comments        Pertinent Vitals/Pain Pain Assessment: 0-10 Pain Score: 6  Pain Location: R knee Pain Descriptors / Indicators: Aching;Sore Pain Intervention(s): Monitored  during session;Repositioned    Home Living                      Prior Function            PT Goals (current goals can now be found in the care plan section) Progress towards PT goals: Progressing toward goals    Frequency    7X/week      PT Plan Current plan remains appropriate    Co-evaluation              AM-PAC PT "6 Clicks" Mobility   Outcome Measure  Help needed turning from your back to your side while in a flat bed without using bedrails?: A Little Help needed moving from lying on your back to sitting on the side of a flat bed without using bedrails?: A Little Help needed moving to and from a bed to a chair (including a wheelchair)?: A Little Help needed standing up from a chair using your arms (e.g., wheelchair or bedside chair)?: A Little Help needed to walk in hospital room?:  A Lot Help needed climbing 3-5 steps with a railing? : A Lot 6 Click Score: 16    End of Session Equipment Utilized During Treatment: Right knee immobilizer Activity Tolerance: Patient tolerated treatment well Patient left: in bed;with call bell/phone within reach;with bed alarm set;with family/visitor present Nurse Communication: Mobility status PT Visit Diagnosis: Difficulty in walking, not elsewhere classified (R26.2);Muscle weakness (generalized) (M62.81)     Time: 4417-1278 PT Time Calculation (min) (ACUTE ONLY): 14 min  Charges:  $Therapeutic Exercise: 8-22 mins                     Jannette Spanner PT, DPT Acute Rehabilitation Services Pager: [phone] Office: [phone]  York Ram E 01/18/2020, 3:03 PM

## 2020-01-18 NOTE — Discharge Summary (Signed)
Patient ID: Jenny Baker MRN: [id number] DOB/AGE: [birth year] 58 y.o.  Admit date: 01/17/2020 Discharge date: 01/18/2020  Admission Diagnoses:  Principal Problem:   Unilateral primary osteoarthritis, right knee Active Problems:   Status post total right knee replacement   Discharge Diagnoses:  Same  Past Medical History:  Diagnosis Date  . Arthritis   . Cancer (HCC)    Basal cell on L side of nose  . Headache    Related to previous concussion   . Vertigo    Current episode 5-21    Surgeries: Procedure(s): RIGHT TOTAL KNEE ARTHROPLASTY on 01/17/2020   Consultants:   Discharged Condition: Improved  Hospital Course: Veanna Dower is an 59 y.o. female who was admitted 01/17/2020 for operative treatment ofUnilateral primary osteoarthritis, right knee. Patient has severe unremitting pain that affects sleep, daily activities, and work/hobbies. After pre-op clearance the patient was taken to the operating room on 01/17/2020 and underwent  Procedure(s): RIGHT TOTAL KNEE ARTHROPLASTY.    Patient was given perioperative antibiotics:  Anti-infectives (From admission, onward)   Start     Dose/Rate Route Frequency Ordered Stop   01/17/20 1400  ceFAZolin (ANCEF) IVPB 1 g/50 mL premix        1 g 100 mL/hr over 30 Minutes Intravenous Every 6 hours 01/17/20 1029 01/17/20 2118   01/17/20 0600  ceFAZolin (ANCEF) IVPB 2g/100 mL premix        2 g 200 mL/hr over 30 Minutes Intravenous On call to O.R. 01/17/20 0737 01/17/20 1062       Patient was given sequential compression devices, early ambulation, and chemoprophylaxis to prevent DVT.  Patient benefited maximally from hospital stay and there were no complications.    Recent vital signs:  Patient Vitals for the past 24 hrs:  BP Temp Temp src Pulse Resp SpO2  01/18/20 0937 (!) 113/55 -- -- 72 -- 98 %  01/18/20 0623 (!) 105/48 98.5 F (36.9 C) Oral 82 16 98 %  01/18/20 0229 (!) 105/57 98.8 F (37.1 C) Oral 71 12 96 %  01/17/20 2145  130/65 98.6 F (37 C) Oral 80 16 97 %  01/17/20 1755 117/67 98.6 F (37 C) Oral 64 12 99 %  01/17/20 1326 127/66 98.3 F (36.8 C) Oral 87 12 100 %  01/17/20 1231 111/64 98.2 F (36.8 C) Oral 79 14 100 %  01/17/20 1136 112/64 97.8 F (36.6 C) Oral 67 10 100 %     Recent laboratory studies:  Recent Labs    01/18/20 0336  WBC 9.8  HGB 10.0*  HCT 30.1*  PLT 177     Discharge Medications:   Allergies as of 01/18/2020   No Known Allergies     Medication List    STOP taking these medications   acetaminophen 500 MG tablet Commonly known as: TYLENOL   ibuprofen 600 MG tablet Commonly known as: ADVIL     TAKE these medications   aspirin 81 MG chewable tablet Chew 1 tablet (81 mg total) by mouth 2 (two) times daily.   CALCIUM PO Take 1 tablet by mouth daily. Vitamin vit D /K 2   HYDROcodone-acetaminophen 5-325 MG tablet Commonly known as: Norco Take 1 tablet by mouth every 6 (six) hours as needed for moderate pain.   HYDROcodone-acetaminophen 5-325 MG tablet Commonly known as: NORCO/VICODIN Take 1-2 tablets by mouth every 4 (four) hours as needed for moderate pain.   Krill Oil 300 MG Caps Take 600 mg by mouth daily.   loratadine 10 MG  tablet Commonly known as: CLARITIN Take 10 mg by mouth daily.   methocarbamol 500 MG tablet Commonly known as: ROBAXIN Take 1 tablet (500 mg total) by mouth every 6 (six) hours as needed for muscle spasms.   OVER THE COUNTER MEDICATION Take 1 mL by mouth daily. CBD oil   TURMERIC PO Take 1 tablet by mouth daily. Joint supplement            Durable Medical Equipment  (From admission, onward)         Start     Ordered   01/17/20 1030  DME Walker rolling  Once       Question Answer Comment  Walker: With 5 Inch Wheels   Patient needs a walker to treat with the following condition Status post total right knee replacement      01/17/20 1029   01/17/20 1030  DME 3 n 1  Once        01/17/20 1029           Diagnostic Studies: DG Knee Right Port  Result Date: 01/17/2020 CLINICAL DATA:  Status post right total knee arthroplasty EXAM: PORTABLE RIGHT KNEE - 1-2 VIEW COMPARISON:  03/20/2018 right knee radiographs FINDINGS: Status post right total knee arthroplasty with well-positioned distal femoral, proximal tibial and posterior patellar prostheses. No dislocation. No acute osseous fracture. No focal osseous lesions. Expected gas within and surrounding the right knee joint. IMPRESSION: Satisfactory immediate postoperative appearance status post right total knee arthroplasty. Electronically Signed   By: Ilona Sorrel M.D.   On: 01/17/2020 09:39    Disposition:  discharge to home    Follow-up Information    Mcarthur Rossetti, MD. Schedule an appointment as soon as possible for a visit in 2 week(s).   Specialty: Orthopedic Surgery Contact information: [address] [place] [address] [phone]                Signed: Erskine Emery 01/18/2020, 10:57 AM

## 2020-01-18 NOTE — Plan of Care (Signed)
Plan of care reviewed and discussed with the patient. 

## 2020-01-19 DIAGNOSIS — M1711 Unilateral primary osteoarthritis, right knee: Secondary | ICD-10-CM | POA: Diagnosis not present

## 2020-01-19 NOTE — Progress Notes (Signed)
PATIENT BEING DISCHARGED HOME WITH HUSBAND IN STABLE CONDITION. PATIENT IS ALERT AND ORIENTED X4. DENIES SOB, NAUSEA OR DIZZINESS. ADMITS TO SLIGHT SURGICAL PAIN RATED MINIMALLY. TED HOSE STOCKINGS ON BILATERALLY. NO FURTHER EPIDSODES SINCE 01/18/2020/

## 2020-01-19 NOTE — Discharge Summary (Signed)
Patient is stable for discharge .Pain medication change and discontinuation of Neurontin have resulted in dizziness symptoms resolving.

## 2020-01-19 NOTE — Progress Notes (Addendum)
Subjective: 2 Days Post-Op Procedure(s) (LRB): RIGHT TOTAL KNEE ARTHROPLASTY (Right) Patient reports pain as moderate.  States Norco is controlling her pain. Feels better after stopping Neurontin. Patient dressed and sitting on edge of bed this AM.   Objective: Vital signs in last 24 hours: Temp:  [98.4 F (36.9 C)-99.4 F (37.4 C)] 99.4 F (37.4 C) (06/27 0435) Pulse Rate:  [72-87] 87 (06/27 0435) Resp:  [14-18] 18 (06/27 0435) BP: (93-132)/(55-75) 132/62 (06/27 0435) SpO2:  [96 %-99 %] 98 % (06/27 0435)  Intake/Output from previous day: 06/26 0701 - 06/27 0700 In: 1740 [P.O.:240; I.V.:1500] Out: 950 [Urine:950] Intake/Output this shift: No intake/output data recorded.  Recent Labs    01/18/20 0336  HGB 10.0*   Recent Labs    01/18/20 0336  WBC 9.8  RBC 3.32*  HCT 30.1*  PLT 177   No results for input(s): NA, K, CL, CO2, BUN, CREATININE, GLUCOSE, CALCIUM in the last 72 hours. No results for input(s): LABPT, INR in the last 72 hours.  Right lower extremity:  Intact pulses distally Dorsiflexion/Plantar flexion intact Incision: dressing C/D/I Compartment soft   Assessment/Plan: 2 Days Post-Op Procedure(s) (LRB): RIGHT TOTAL KNEE ARTHROPLASTY (Right) Up with therapy Discharge home with home health      Erskine Emery 01/19/2020, 8:23 AM

## 2020-01-19 NOTE — Plan of Care (Signed)
  Problem: Health Behavior/Discharge Planning: Goal: Ability to manage health-related needs will improve Outcome: Adequate for Discharge   Problem: Clinical Measurements: Goal: Ability to maintain clinical measurements within normal limits will improve Outcome: Adequate for Discharge Goal: Will remain free from infection Outcome: Adequate for Discharge Goal: Diagnostic test results will improve Outcome: Adequate for Discharge Goal: Respiratory complications will improve Outcome: Adequate for Discharge   Problem: Activity: Goal: Risk for activity intolerance will decrease Outcome: Adequate for Discharge   Problem: Elimination: Goal: Will not experience complications related to bowel motility Outcome: Adequate for Discharge   Problem: Pain Managment: Goal: General experience of comfort will improve Outcome: Adequate for Discharge   Problem: Safety: Goal: Ability to remain free from injury will improve Outcome: Adequate for Discharge   Problem: Skin Integrity: Goal: Risk for impaired skin integrity will decrease Outcome: Adequate for Discharge   Problem: Education: Goal: Knowledge of the prescribed therapeutic regimen will improve Outcome: Adequate for Discharge   Problem: Activity: Goal: Ability to avoid complications of mobility impairment will improve Outcome: Adequate for Discharge Goal: Range of joint motion will improve Outcome: Adequate for Discharge   Problem: Clinical Measurements: Goal: Postoperative complications will be avoided or minimized Outcome: Adequate for Discharge   Problem: Pain Management: Goal: Pain level will decrease with appropriate interventions Outcome: Adequate for Discharge   Problem: Skin Integrity: Goal: Will show signs of wound healing Outcome: Adequate for Discharge

## 2020-01-19 NOTE — Progress Notes (Signed)
Physical Therapy Treatment Patient Details Name: Jenny Baker MRN: [id number] DOB: 1960-11-08 Today's Date: 01/19/2020    History of Present Illness Pt s/p R TKR  and with hx of vertigo    PT Comments    Pt progressing well today. Feeling much better. Reviewed TKA HEP/ROM, stairs and gait. Ready for d/c from PT standpoint   Follow Up Recommendations  Follow surgeon's recommendation for DC plan and follow-up therapies     Equipment Recommendations  Rolling walker with 5" wheels;3in1 (PT)    Recommendations for Other Services       Precautions / Restrictions Precautions Precautions: Fall;Knee Required Braces or Orthoses: Knee Immobilizer - Right Knee Immobilizer - Right: Discontinue once straight leg raise with < 10 degree lag Restrictions Weight Bearing Restrictions: No Other Position/Activity Restrictions: WBAT    Mobility  Bed Mobility Overal bed mobility: Needs Assistance Bed Mobility: Supine to Sit     Supine to sit: Min guard;Modified independent (Device/Increase time)        Transfers Overall transfer level: Needs assistance Equipment used: Rolling walker (2 wheeled) Transfers: Sit to/from Stand Sit to Stand: Supervision;Modified independent (Device/Increase time)         General transfer comment: self corrects   Ambulation/Gait Ambulation/Gait assistance: Min guard Gait Distance (Feet): 120 Feet Assistive device: Rolling walker (2 wheeled) Gait Pattern/deviations: Step-to pattern;Trunk flexed;Step-through pattern Gait velocity: decr   General Gait Details: beginning step through, working on terminal knee extension and heel to floor in stance (chair follow for safety )   Stairs Stairs: Yes Stairs assistance: Min guard Stair Management: Two rails;Step to pattern;Forwards Number of Stairs: 3 General stair comments: cues for sequence    Wheelchair Mobility    Modified Rankin (Stroke Patients Only)       Balance                                             Cognition Arousal/Alertness: Awake/alert Behavior During Therapy: WFL for tasks assessed/performed Overall Cognitive Status: Within Functional Limits for tasks assessed                                        Exercises Total Joint Exercises Quad Sets: AROM;Right;10 reps Heel Slides: AAROM;Right;10 reps Knee Flexion: AROM;Right;10 reps;Seated Goniometric ROM: grossly 6 to 75 degrees right knee flexion     General Comments        Pertinent Vitals/Pain Pain Assessment: 0-10 Pain Score: 4  (with activity) Pain Location: R knee Pain Descriptors / Indicators: Aching;Sore Pain Intervention(s): Limited activity within patient's tolerance;Monitored during session;Premedicated before session;Repositioned    Home Living                      Prior Function            PT Goals (current goals can now be found in the care plan section) Acute Rehab PT Goals Patient Stated Goal: Regain IND PT Goal Formulation: With patient Time For Goal Achievement: 01/24/20 Potential to Achieve Goals: Good Progress towards PT goals: Progressing toward goals    Frequency    7X/week      PT Plan Current plan remains appropriate    Co-evaluation              AM-PAC PT "6 Clicks"  Mobility   Outcome Measure  Help needed turning from your back to your side while in a flat bed without using bedrails?: None Help needed moving from lying on your back to sitting on the side of a flat bed without using bedrails?: None Help needed moving to and from a bed to a chair (including a wheelchair)?: None Help needed standing up from a chair using your arms (e.g., wheelchair or bedside chair)?: A Little Help needed to walk in hospital room?: A Little Help needed climbing 3-5 steps with a railing? : A Little 6 Click Score: 21    End of Session Equipment Utilized During Treatment: Right knee immobilizer;Gait belt Activity Tolerance:  Patient tolerated treatment well Patient left: in chair;with call bell/phone within reach;with chair alarm set Nurse Communication: Mobility status PT Visit Diagnosis: Difficulty in walking, not elsewhere classified (R26.2);Muscle weakness (generalized) (M62.81)     Time: 4268-3419 PT Time Calculation (min) (ACUTE ONLY): 25 min  Charges:  $Gait Training: 23-37 mins                     Baxter Flattery, PT  Acute Rehab Dept ([place]) [phone] Pager [phone]  01/19/2020    [facility] 01/19/2020, 10:47 AM

## 2020-01-20 ENCOUNTER — Encounter (HOSPITAL_COMMUNITY): Payer: Self-pay | Admitting: Orthopaedic Surgery

## 2020-01-21 ENCOUNTER — Telehealth: Payer: Self-pay | Admitting: Orthopaedic Surgery

## 2020-01-21 ENCOUNTER — Other Ambulatory Visit: Payer: Self-pay

## 2020-01-21 DIAGNOSIS — Z96651 Presence of right artificial knee joint: Secondary | ICD-10-CM

## 2020-01-21 NOTE — Telephone Encounter (Signed)
Sent referral for [facility]

## 2020-01-21 NOTE — Telephone Encounter (Signed)
Patient called. She would like a referral for PT. She lives in [place]. Would like to go somewhere in [facility]. Her call back number is [phone]

## 2020-01-23 ENCOUNTER — Ambulatory Visit (INDEPENDENT_AMBULATORY_CARE_PROVIDER_SITE_OTHER): Payer: 59 | Admitting: Rehabilitative and Restorative Service Providers"

## 2020-01-23 ENCOUNTER — Other Ambulatory Visit: Payer: Self-pay

## 2020-01-23 ENCOUNTER — Telehealth: Payer: Self-pay | Admitting: Orthopaedic Surgery

## 2020-01-23 DIAGNOSIS — M6281 Muscle weakness (generalized): Secondary | ICD-10-CM | POA: Diagnosis not present

## 2020-01-23 DIAGNOSIS — R600 Localized edema: Secondary | ICD-10-CM

## 2020-01-23 DIAGNOSIS — M25561 Pain in right knee: Secondary | ICD-10-CM

## 2020-01-23 DIAGNOSIS — R2689 Other abnormalities of gait and mobility: Secondary | ICD-10-CM

## 2020-01-23 NOTE — Therapy (Addendum)
[place], [place], [address] Phone: [phone]   Fax:  [phone]  Physical Therapy Evaluation  Patient Details  Name: Jenny Baker MRN: [id number] Date of Birth: [birth year] Referring Provider (PT): Jean Rosenthal, MD   Encounter Date: 01/23/2020   PT End of Session - 01/23/20 1821    Visit Number 1    Number of Visits 12    Date for PT Re-Evaluation 03/05/20    Authorization Type aetna    PT Start Time 1540    PT Stop Time 1622    PT Time Calculation (min) 42 min           Past Medical History:  Diagnosis Date  . Arthritis   . Cancer (HCC)    Basal cell on L side of nose  . Headache    Related to previous concussion   . Vertigo    Current episode 5-21    Past Surgical History:  Procedure Laterality Date  . APPENDECTOMY    . CESAREAN SECTION    . KNEE ARTHROSCOPY    . TOTAL KNEE ARTHROPLASTY Right 01/17/2020   Procedure: RIGHT TOTAL KNEE ARTHROPLASTY;  Surgeon: Mcarthur Rossetti, MD;  Location: [facility];  Service: Orthopedics;  Laterality: Right;    There were no vitals filed for this visit.    Subjective Assessment - 01/23/20 1544    Subjective The patient is s/p R TKR on 01/17/20.  She tore her meniscus 2 years ago when running and had an arthroscopic knee surgery.  She continued to have ongoing knee issues and had TKR last week.  She arrives today with RW and notes pain in calf musculature.    Patient Stated Goals Walk normally, be able to hike without pain.    Currently in Pain? Yes    Pain Score 0-No pain   goes up to 5-7/10 with exercises   Pain Location Knee    Pain Orientation Right    Pain Descriptors / Indicators Aching;Sore    Pain Type Surgical pain    Pain Radiating Towards notes pain in gastroc    Aggravating Factors  ther ex    Pain Relieving Factors meds; unable to get comfortable to sleep              [facility] PT Assessment - 01/23/20 1547       Assessment   Medical Diagnosis s/p R TKR 01/17/20    Referring Provider (PT) Jean Rosenthal, MD    Onset Date/Surgical Date 01/17/20    Prior Therapy none      Precautions   Precautions None      Restrictions   Weight Bearing Restrictions Yes    RLE Weight Bearing Weight bearing as tolerated      Balance Screen   Has the patient fallen in the past 6 months No    Has the patient had a decrease in activity level because of a fear of falling?  No    Is the patient reluctant to leave their home because of a fear of falling?  No      Home Social worker Private residence    Living Arrangements Spouse/significant other;Children    [place] to enter    Entrance Stairs-Number of Steps 3    Entrance Stairs-Rails Can reach both    [place] One level    [place] - 2 wheels      Prior Function   Level  of Independence Independent    Vocation Full time employment    Vocation Requirements surgical nurse      Observation/Other Assessments   Focus on Therapeutic Outcomes (FOTO)  60% limited      Observation/Other Assessments-Edema    Edema --   significant swelling in right knee/ limits ROM     Sensation   Light Touch Appears Intact      Posture/Postural Control   Posture/Postural Control No significant limitations      ROM / Strength   AROM / PROM / Strength AROM;PROM;Strength      AROM   Overall AROM  Deficits    Overall AROM Comments 20 degree R knee extensor lag during LAQ    AROM Assessment Site Knee;Ankle    Right/Left Knee Right    Right Knee Extension -12   in long sitting with quad set   Right Knee Flexion 76   in sitting   Right/Left Ankle Right    Right Ankle Dorsiflexion 0   tightness noted in R gastrocs     PROM   Overall PROM  Deficits    PROM Assessment Site Knee    Right/Left Knee Right    Right Knee Extension -9    Right Knee Flexion 78      Strength   Overall Strength Deficits    Overall Strength  Comments can perform a quad set and elicit good motor contraction; patient able to perform SLR with knee extension -15 degrees      Flexibility   Soft Tissue Assessment /Muscle Length yes    Hamstrings tightness in hamstrings, gastrocs      Palpation   Patella mobility TBA    Palpation comment patient does not have tenderness to palpation of R calf      Ambulation/Gait   Ambulation/Gait Yes    Ambulation/Gait Assistance 6: Modified independent (Device/Increase time)    Ambulation Distance (Feet) 75 Feet    Assistive device Rolling walker    Gait Pattern Step-to pattern;Decreased stride length;Decreased stance time - right;Decreased hip/knee flexion - right;Decreased dorsiflexion - right;Decreased weight shift to right    Ambulation Surface Level;Indoor    Gait velocity 1.32 ft/sec    Stairs Yes    Stairs Assistance 6: Modified independent (Device/Increase time)    Stair Management Technique Two rails;Step to pattern    Gait Comments Worked on gait mechanics emphasizing R knee flexion to initiate swing phase, R heel strike and keeping R heel down (to reduce gastroc soreness)                      Objective measurements completed on examination: See above findings.       [facility] Adult PT Treatment/Exercise - 01/23/20 1547      Exercises   Exercises Knee/Hip      Knee/Hip Exercises: Stretches   Gastroc Stretch Right;2 reps;30 seconds    Gastroc Stretch Limitations cues to avoid hip flexion as compensatory movement      Knee/Hip Exercises: Seated   Long Arc Quad Strengthening;Right    Heel Slides AAROM;Right      Knee/Hip Exercises: Supine   Quad Sets Strengthening;Right;10 reps    Quad Sets Limitations with pillow under heel    Straight Leg Raises Strengthening;Right;10 reps      Knee/Hip Exercises: Sidelying   Hip ABduction Strengthening;Right;10 reps           Vaso: performed x 10 minutes R knee       PT  Education - 01/23/20 1821    Education  Details HEP    Person(s) Educated Patient    Methods Explanation;Demonstration;Handout    Comprehension Verbalized understanding;Returned demonstration               PT Long Term Goals - 01/23/20 1822      PT LONG TERM GOAL #1   Title The patient will be indep with HEP.    Time 6    Period Weeks    Target Date 03/05/20      PT LONG TERM GOAL #2   Title The patient will reduce functional limitation per FOTO to < or equal to 19%.    Baseline 60% limited    Time 6    Period Weeks    Target Date 03/05/20      PT LONG TERM GOAL #3   Title The patient will improve gait speed to > 3.0 ft/sec without a device independently.    Baseline 1.32 ft/sec    Time 6    Period Weeks    Target Date 03/05/20      PT LONG TERM GOAL #4   Title The patient will imrpove AROM R knee flexion from 76 degrees to > or equal to 105 degrees.    Time 6    Period Weeks    Target Date 03/05/20      PT LONG TERM GOAL #5   Title The patient will improve AROM R knee extension from -12 degrees to - 3 degrees.    Time 6    Period Weeks    Target Date 03/05/20      Additional Long Term Goals   Additional Long Term Goals Yes      PT LONG TERM GOAL #6   Title The patient will negotiate 4 steps without a handrail with reciprocal pattern indep    Time 6    Period Weeks    Target Date 03/05/20                  Plan - 01/23/20 1826    Clinical Impression Statement The patient is a 59 yo female s/p R TKR on 01/17/20.  She presents with impairments in AROM R knee, dec'd muscle strength, edema, dec'd PROM R knee, tightness in R LE musculature, pain in gastroc musculature, and gait abnormalities.  Functional limitations include dec'd household/community ambulation, dec'd participation in work activities and dec'd IADLs.  PT to address deficits to return to prior functional status.    Examination-Activity Limitations Locomotion Level;Squat;Stairs;Stand    Examination-Participation Restrictions  Community Activity    Stability/Clinical Decision Making Stable/Uncomplicated    Clinical Decision Making Low    Rehab Potential Good    PT Frequency 2x / week    PT Duration 6 weeks    PT Treatment/Interventions ADLs/Self Care Home Management;Gait training;Stair training;Therapeutic activities;Therapeutic exercise;Neuromuscular re-education;Manual techniques;Taping;Patient/family education;Dry needling;Passive range of motion;Electrical Stimulation;Moist Heat;Cryotherapy;Vasopneumatic Device    PT Next Visit Plan REview HEP, LE strengthening progression, gait training, vaso to reduce edema    PT Home Exercise Plan Access Code: [facility]    Consulted and Agree with Plan of Care Patient           Patient will benefit from skilled therapeutic intervention in order to improve the following deficits and impairments:  Abnormal gait, Decreased range of motion, Decreased strength, Pain, Increased edema, Decreased scar mobility, Impaired flexibility, Increased fascial restricitons  Visit Diagnosis: Acute pain of right knee  Muscle weakness (generalized)  Localized edema  Other abnormalities of gait and mobility     Problem List Patient Active Problem List   Diagnosis Date Noted  . Status post total right knee replacement 01/17/2020  . Unilateral primary osteoarthritis, right knee 11/18/2019  . Basal cell carcinoma of left cheek 02/08/2019  . Changing skin lesion 12/21/2018  . History of basal cell carcinoma (BCC) 12/21/2018  . Chronic pain of right knee 09/06/2017    Cletus Paris, PT 01/23/2020, 6:29 PM  [facility] [place], [place], [address] Phone: [phone]   Fax:  [phone]  Name: Lafern Brinkley MRN: [id number] Date of Birth: 01-05-1961

## 2020-01-23 NOTE — Patient Instructions (Signed)
Access Code: [facility] URL: [email or web address] Date: 01/23/2020 Prepared by: Rudell Cobb  Exercises Quad Setting and Stretching - 2 x daily - 7 x weekly - 1 sets - 10 reps - 5 seconds hold Active Straight Leg Raise with Quad Set - 2 x daily - 7 x weekly - 1 sets - 5-10 reps Sidelying Hip Abduction - 2 x daily - 7 x weekly - 1 sets - 10 reps Seated Knee Flexion AAROM - 2 x daily - 7 x weekly - 1 sets - 5 reps - 10 seconds hold Seated Long Arc Quad - 2 x daily - 7 x weekly - 1 sets - 10 reps Gastroc Stretch on Wall - 2 x daily - 7 x weekly - 1 sets - 3 reps - 20-30 seconds hold

## 2020-01-23 NOTE — Telephone Encounter (Signed)
Forms received from [employer]. Sent to [employer]. 

## 2020-01-28 ENCOUNTER — Ambulatory Visit (INDEPENDENT_AMBULATORY_CARE_PROVIDER_SITE_OTHER): Payer: 59 | Admitting: Physical Therapy

## 2020-01-28 ENCOUNTER — Other Ambulatory Visit: Payer: Self-pay

## 2020-01-28 ENCOUNTER — Encounter: Payer: Self-pay | Admitting: Physical Therapy

## 2020-01-28 DIAGNOSIS — R600 Localized edema: Secondary | ICD-10-CM | POA: Diagnosis not present

## 2020-01-28 DIAGNOSIS — M25561 Pain in right knee: Secondary | ICD-10-CM | POA: Diagnosis not present

## 2020-01-28 DIAGNOSIS — R2689 Other abnormalities of gait and mobility: Secondary | ICD-10-CM

## 2020-01-28 DIAGNOSIS — M6281 Muscle weakness (generalized): Secondary | ICD-10-CM

## 2020-01-28 NOTE — Therapy (Signed)
[place], [place], [address] Phone: [phone]   Fax:  [phone]  Physical Therapy Treatment  Patient Details  Name: Jenny Baker MRN: [id number] Date of Birth: 09-11-1960 Referring Provider (PT): Jean Rosenthal, MD   Encounter Date: 01/28/2020   PT End of Session - 01/28/20 0842    Visit Number 2    Number of Visits 12    Date for PT Re-Evaluation 03/05/20    Authorization Type aetna    PT Start Time 0800    PT Stop Time 0849    PT Time Calculation (min) 49 min    Activity Tolerance Patient tolerated treatment well    Behavior During Therapy [facility] for tasks assessed/performed           Past Medical History:  Diagnosis Date  . Arthritis   . Cancer (HCC)    Basal cell on L side of nose  . Headache    Related to previous concussion   . Vertigo    Current episode 5-21    Past Surgical History:  Procedure Laterality Date  . APPENDECTOMY    . CESAREAN SECTION    . KNEE ARTHROSCOPY    . TOTAL KNEE ARTHROPLASTY Right 01/17/2020   Procedure: RIGHT TOTAL KNEE ARTHROPLASTY;  Surgeon: Mcarthur Rossetti, MD;  Location: [facility];  Service: Orthopedics;  Laterality: Right;    There were no vitals filed for this visit.   Subjective Assessment - 01/28/20 0817    Subjective Pt reports complaints of stiffness in Rt knee this morning, "It's like starting over".  She is using single crutch around the house, RW in community.    Patient Stated Goals Walk normally, be able to hike without pain.    Currently in Pain? Yes    Pain Score 3     Pain Location Knee    Pain Orientation Right    Pain Descriptors / Indicators Dull;Aching    Aggravating Factors  moving    Pain Relieving Factors ice, meds              [facility] PT Assessment - 01/28/20 0001      Assessment   Medical Diagnosis s/p R TKR 01/17/20    Referring Provider (PT) Jean Rosenthal, MD    Onset Date/Surgical Date  01/17/20    Prior Therapy none      PROM   Right Knee Flexion 92   seated scoot           [facility] Adult PT Treatment/Exercise - 01/28/20 0001      Knee/Hip Exercises: Stretches   Passive Hamstring Stretch Right;2 reps;30 seconds   seated, hip hinge with overpressure from pt.    Knee: Self-Stretch Limitations seated scoot x 6 reps of 10 sec holds    Other Knee/Hip Stretches incline board for calf stretch       Knee/Hip Exercises: Aerobic   Nustep L1: 5 min for ROM      Knee/Hip Exercises: Standing   Forward Step Up Right;1 set;5 reps;Hand Hold: 2    Forward Step Up Limitations Rt toe taps to 6" step with focus on avoiding hip hike     Other Standing Knee Exercises forward lunge with Rt foot on 6" and 12" step holding 10 sec for inc Rt knee flexion, followed by Rt knee ext over pressure x 5 reps      Knee/Hip Exercises: Seated   Sit to Sand 5 reps;with UE support;without UE support  Knee/Hip Exercises: Supine   Quad Sets Strengthening;Right;1 set;5 reps   10 sec    Quad Sets Limitations small noodle under knee       Knee/Hip Exercises: Sidelying   Hip ABduction Strengthening;Right;10 reps      Knee/Hip Exercises: Prone   Hamstring Curl 5 reps;2 sets    Hamstring Curl Limitations in between prone hang sets    Hip Extension Strengthening;10 reps;Right;Left;1 set    Prone Knee Hang --   30 sec x1, 60 sec x 1   Other Prone Exercises TKE quad set x 10 sec x 5 reps       Modalities   Modalities Vasopneumatic      Vasopneumatic   Number Minutes Vasopneumatic  10 minutes    Vasopnuematic Location  Knee   Rt   Vasopneumatic Pressure Medium    Vasopneumatic Temperature  34 deg                  PT Education - 01/28/20 0844    Education Details expectations following TKR (ie: stiffness, need to ice/elevate, sleep)    Person(s) Educated Patient    Methods Explanation    Comprehension Verbalized understanding               PT Long Term Goals - 01/23/20 1822        PT LONG TERM GOAL #1   Title The patient will be indep with HEP.    Time 6    Period Weeks    Target Date 03/05/20      PT LONG TERM GOAL #2   Title The patient will reduce functional limitation per FOTO to < or equal to 19%.    Baseline 60% limited    Time 6    Period Weeks    Target Date 03/05/20      PT LONG TERM GOAL #3   Title The patient will improve gait speed to > 3.0 ft/sec without a device independently.    Baseline 1.32 ft/sec    Time 6    Period Weeks    Target Date 03/05/20      PT LONG TERM GOAL #4   Title The patient will imrpove AROM R knee flexion from 76 degrees to > or equal to 105 degrees.    Time 6    Period Weeks    Target Date 03/05/20      PT LONG TERM GOAL #5   Title The patient will improve AROM R knee extension from -12 degrees to - 3 degrees.    Time 6    Period Weeks    Target Date 03/05/20      Additional Long Term Goals   Additional Long Term Goals Yes      PT LONG TERM GOAL #6   Title The patient will negotiate 4 steps without a handrail with reciprocal pattern indep    Time 6    Period Weeks    Target Date 03/05/20                 Plan - 01/28/20 0840    Clinical Impression Statement Good improvement in Rt knee flexion ROM.  Continued deficit in Rt knee ext.  Pt tolerated all exercises with mild increase in pain.  Progressing well towards established goals.    Examination-Activity Limitations Locomotion Level;Squat;Stairs;Stand    Examination-Participation Restrictions Community Activity    Stability/Clinical Decision Making Stable/Uncomplicated    Rehab Potential Good    PT Frequency 2x /  week    PT Duration 6 weeks    PT Treatment/Interventions ADLs/Self Care Home Management;Gait training;Stair training;Therapeutic activities;Therapeutic exercise;Neuromuscular re-education;Manual techniques;Taping;Patient/family education;Dry needling;Passive range of motion;Electrical Stimulation;Moist Heat;Cryotherapy;Vasopneumatic  Device    PT Next Visit Plan REview HEP, LE strengthening progression, gait training, vaso to reduce edema    PT Home Exercise Plan Access Code: [facility]    Consulted and Agree with Plan of Care Patient           Patient will benefit from skilled therapeutic intervention in order to improve the following deficits and impairments:  Abnormal gait, Decreased range of motion, Decreased strength, Pain, Increased edema, Decreased scar mobility, Impaired flexibility, Increased fascial restricitons  Visit Diagnosis: Acute pain of right knee  Muscle weakness (generalized)  Localized edema  Other abnormalities of gait and mobility     Problem List Patient Active Problem List   Diagnosis Date Noted  . Status post total right knee replacement 01/17/2020  . Unilateral primary osteoarthritis, right knee 11/18/2019  . Basal cell carcinoma of left cheek 02/08/2019  . Changing skin lesion 12/21/2018  . History of basal cell carcinoma (BCC) 12/21/2018  . Chronic pain of right knee 09/06/2017   Kerin Perna, PTA 01/28/20 10:45 AM  [place], [place], [address] Phone: [phone]   Fax:  [phone]  Name: Jenny Baker MRN: [id number] Date of Birth: January 10, 1961

## 2020-01-30 ENCOUNTER — Encounter: Payer: Self-pay | Admitting: Rehabilitative and Restorative Service Providers"

## 2020-01-30 ENCOUNTER — Other Ambulatory Visit: Payer: Self-pay

## 2020-01-30 ENCOUNTER — Ambulatory Visit (INDEPENDENT_AMBULATORY_CARE_PROVIDER_SITE_OTHER): Payer: 59 | Admitting: Rehabilitative and Restorative Service Providers"

## 2020-01-30 ENCOUNTER — Encounter: Payer: Self-pay | Admitting: Orthopaedic Surgery

## 2020-01-30 ENCOUNTER — Ambulatory Visit (INDEPENDENT_AMBULATORY_CARE_PROVIDER_SITE_OTHER): Payer: 59 | Admitting: Orthopaedic Surgery

## 2020-01-30 DIAGNOSIS — R600 Localized edema: Secondary | ICD-10-CM | POA: Diagnosis not present

## 2020-01-30 DIAGNOSIS — M25561 Pain in right knee: Secondary | ICD-10-CM

## 2020-01-30 DIAGNOSIS — M6281 Muscle weakness (generalized): Secondary | ICD-10-CM

## 2020-01-30 DIAGNOSIS — R2689 Other abnormalities of gait and mobility: Secondary | ICD-10-CM | POA: Diagnosis not present

## 2020-01-30 DIAGNOSIS — Z96651 Presence of right artificial knee joint: Secondary | ICD-10-CM

## 2020-01-30 NOTE — Patient Instructions (Signed)
Access Code: [email or web address]: [email or web address]: 07/08/2021Prepared by: Ilay Capshaw HoltExercises  Quad Setting and Stretching - 2 x daily - 7 x weekly - 1 sets - 10 reps - 5 seconds hold  Active Straight Leg Raise with Quad Set - 2 x daily - 7 x weekly - 1 sets - 5-10 reps  Sidelying Hip Abduction - 2 x daily - 7 x weekly - 1 sets - 10 reps  Seated Knee Flexion AAROM - 2 x daily - 7 x weekly - 1 sets - 5 reps - 10 seconds hold  Seated Long Arc Quad - 2 x daily - 7 x weekly - 1 sets - 10 reps  Gastroc Stretch on Wall - 2 x daily - 7 x weekly - 1 sets - 3 reps - 20-30 seconds hold  [place] Stretch on Wall - 2 x daily - 7 x weekly - 1 sets - 3 reps - 30 sec hold  Seated Hamstring Stretch - 2 x daily - 7 x weekly - 1 sets - 3 reps - 30 sec hold  Standing Terminal Knee Extension at Wall with Ball - 2 x daily - 7 x weekly - 1 sets - 10 reps - 5 sec hold  Supine Knee Extension Strengthening - 2 x daily - 7 x weekly - 1 sets - 10 reps - 5-10 sec hold

## 2020-01-30 NOTE — Progress Notes (Signed)
Marsela comes in today 13 days status post a right total knee arthroplasty.  She is in outpatient therapy and reports that she is doing well.  She is denying any foot and ankle swelling and denies any right calf pain.  She is only been taking her aspirin once a day though.  She is off of narcotics except for occasional hydrocodone when she goes to bed.  On examination her right knee incision looks good.  We remove the old Steri-Strips in place new ones.  She has moderate knee swelling to be expected.  Calf is soft.  She is able to flex her knee to just past 90 degrees.  She continue to push herself through therapy.  She can try Aleve twice daily for inflammation and pain.  If she runs low on hydrocodone she knows to call us.  I will see her back in 4 weeks to see how she is doing overall.

## 2020-01-30 NOTE — Therapy (Signed)
[place], [place], [address] Phone: [phone]   Fax:  [phone]  Physical Therapy Treatment  Patient Details  Name: Jenny Baker MRN: [id number] Date of Birth: 12/26/1960 Referring Provider (PT): Jean Rosenthal, MD   Encounter Date: 01/30/2020   PT End of Session - 01/30/20 1058    Visit Number 3    Number of Visits 12    Date for PT Re-Evaluation 03/05/20    Authorization Type aetna    PT Start Time 1059    PT Stop Time 1149    PT Time Calculation (min) 50 min    Activity Tolerance Patient tolerated treatment well           Past Medical History:  Diagnosis Date  . Arthritis   . Cancer (HCC)    Basal cell on L side of nose  . Headache    Related to previous concussion   . Vertigo    Current episode 5-21    Past Surgical History:  Procedure Laterality Date  . APPENDECTOMY    . CESAREAN SECTION    . KNEE ARTHROSCOPY    . TOTAL KNEE ARTHROPLASTY Right 01/17/2020   Procedure: RIGHT TOTAL KNEE ARTHROPLASTY;  Surgeon: Mcarthur Rossetti, MD;  Location: [facility];  Service: Orthopedics;  Laterality: Right;    There were no vitals filed for this visit.   Subjective Assessment - 01/30/20 1102    Subjective Patient reports that her Rt knee is still stiff. the calf is so tight. It is getting a bit better. Working on her exercises at home.    Currently in Pain? Yes    Pain Score 0-No pain    Pain Location Knee    Pain Orientation Right    Pain Descriptors / Indicators Tightness   stiffness is 3/10   Pain Type Surgical pain                             [facility] Adult PT Treatment/Exercise - 01/30/20 0001      Self-Care   Self-Care --   self massage Rt LE w/massage stick to pt tolerance      Therapeutic Activites    Therapeutic Activities --   adjusted crutch to proper height      Knee/Hip Exercises: Stretches   Passive Hamstring Stretch Limitations seated  hip hinge HS stretch 30 sec x 3 reps     Gastroc Stretch Right;2 reps;30 seconds    Gastroc Stretch Limitations cues to avoid hip flexion as compensatory movement    Soleus Stretch Right;2 reps;30 seconds    Other Knee/Hip Stretches incline board for calf stretch 30 sec x 3 reps       Knee/Hip Exercises: Aerobic   Nustep L4 x 6 min for ROM UE 9      Knee/Hip Exercises: Standing   Gait Training working on extension of Rt knee with walking forward x 80 ft x 3 reps; backward s 20 ft x 3 reps with verbal cues of PT     Other Standing Knee Exercises quad set small ball behind knee 5 sec hold x 10 reps; standing with knee straight pushing hips away from wall hold x 5 sec x 5 reps       Knee/Hip Exercises: Supine   Quad Sets Strengthening;Right;10 reps   10 sec hold    Short Arc Quad Sets AROM;Strengthening;Right;10 reps   5 sec hold  Modalities   Modalities Vasopneumatic      Vasopneumatic   Number Minutes Vasopneumatic  10 minutes    Vasopnuematic Location  Knee   Rt   Vasopneumatic Pressure Medium    Vasopneumatic Temperature  34 deg      Manual Therapy   Manual therapy comments pt supine     Passive ROM PT assist into knee ext w/ heel propped on roll 10 sec hold x 3 reps                   PT Education - 01/30/20 1146    Education Details HEP    Person(s) Educated Patient    Methods Explanation;Demonstration;Tactile cues;Verbal cues;Handout    Comprehension Verbalized understanding;Returned demonstration;Verbal cues required;Tactile cues required               PT Long Term Goals - 01/23/20 1822      PT LONG TERM GOAL #1   Title The patient will be indep with HEP.    Time 6    Period Weeks    Target Date 03/05/20      PT LONG TERM GOAL #2   Title The patient will reduce functional limitation per FOTO to < or equal to 19%.    Baseline 60% limited    Time 6    Period Weeks    Target Date 03/05/20      PT LONG TERM GOAL #3   Title The patient will  improve gait speed to > 3.0 ft/sec without a device independently.    Baseline 1.32 ft/sec    Time 6    Period Weeks    Target Date 03/05/20      PT LONG TERM GOAL #4   Title The patient will imrpove AROM R knee flexion from 76 degrees to > or equal to 105 degrees.    Time 6    Period Weeks    Target Date 03/05/20      PT LONG TERM GOAL #5   Title The patient will improve AROM R knee extension from -12 degrees to - 3 degrees.    Time 6    Period Weeks    Target Date 03/05/20      Additional Long Term Goals   Additional Long Term Goals Yes      PT LONG TERM GOAL #6   Title The patient will negotiate 4 steps without a handrail with reciprocal pattern indep    Time 6    Period Weeks    Target Date 03/05/20                 Plan - 01/30/20 1105    Clinical Impression Statement Continued knee rehab work on ROM and strength as well as gait with single crutch. Focus on knee extension.    Rehab Potential Good    PT Frequency 2x / week    PT Duration 6 weeks    PT Treatment/Interventions ADLs/Self Care Home Management;Gait training;Stair training;Therapeutic activities;Therapeutic exercise;Neuromuscular re-education;Manual techniques;Taping;Patient/family education;Dry needling;Passive range of motion;Electrical Stimulation;Moist Heat;Cryotherapy;Vasopneumatic Device    PT Next Visit Plan REview HEP, LE strengthening progression, gait training, vaso to reduce edema    PT Home Exercise Plan Access Code: [place] and Agree with Plan of Care Patient           Patient will benefit from skilled therapeutic intervention in order to improve the following deficits and impairments:     Visit Diagnosis: Acute pain of right  knee  Muscle weakness (generalized)  Localized edema  Other abnormalities of gait and mobility     Problem List Patient Active Problem List   Diagnosis Date Noted  . Status post total right knee replacement 01/17/2020  . Unilateral  primary osteoarthritis, right knee 11/18/2019  . Basal cell carcinoma of left cheek 02/08/2019  . Changing skin lesion 12/21/2018  . History of basal cell carcinoma (BCC) 12/21/2018  . Chronic pain of right knee 09/06/2017    Jenny Baker Nilda Simmer PT, MPH  01/30/2020, 11:47 AM  [facility] [place], [place], [address] Phone: [phone]   Fax:  [phone]  Name: Tamryn Popko MRN: [id number] Date of Birth: 1961/01/03

## 2020-02-04 ENCOUNTER — Encounter: Payer: 59 | Admitting: Rehabilitative and Restorative Service Providers"

## 2020-02-05 ENCOUNTER — Ambulatory Visit (INDEPENDENT_AMBULATORY_CARE_PROVIDER_SITE_OTHER): Payer: 59 | Admitting: Physical Therapy

## 2020-02-05 ENCOUNTER — Other Ambulatory Visit: Payer: Self-pay

## 2020-02-05 ENCOUNTER — Encounter: Payer: Self-pay | Admitting: Physical Therapy

## 2020-02-05 DIAGNOSIS — M25561 Pain in right knee: Secondary | ICD-10-CM

## 2020-02-05 DIAGNOSIS — R2689 Other abnormalities of gait and mobility: Secondary | ICD-10-CM

## 2020-02-05 DIAGNOSIS — R600 Localized edema: Secondary | ICD-10-CM

## 2020-02-05 DIAGNOSIS — M6281 Muscle weakness (generalized): Secondary | ICD-10-CM | POA: Diagnosis not present

## 2020-02-05 NOTE — Therapy (Signed)
[place], [place], [address] Phone: [phone]   Fax:  [phone]  Physical Therapy Treatment  Patient Details  Name: Jenny Baker MRN: [id number] Date of Birth: 06/25/1961 Referring Provider (PT): Jean Rosenthal, MD   Encounter Date: 02/05/2020   PT End of Session - 02/05/20 1231    Visit Number 4    Number of Visits 12    Date for PT Re-Evaluation 03/05/20    Authorization Type aetna    PT Start Time 1147    PT Stop Time 1238    PT Time Calculation (min) 51 min    Activity Tolerance Patient tolerated treatment well    Behavior During Therapy [facility] for tasks assessed/performed           Past Medical History:  Diagnosis Date  . Arthritis   . Cancer (HCC)    Basal cell on L side of nose  . Headache    Related to previous concussion   . Vertigo    Current episode 5-21    Past Surgical History:  Procedure Laterality Date  . APPENDECTOMY    . CESAREAN SECTION    . KNEE ARTHROSCOPY    . TOTAL KNEE ARTHROPLASTY Right 01/17/2020   Procedure: RIGHT TOTAL KNEE ARTHROPLASTY;  Surgeon: Mcarthur Rossetti, MD;  Location: [facility];  Service: Orthopedics;  Laterality: Right;    There were no vitals filed for this visit.   Subjective Assessment - 02/05/20 1152    Subjective Pt reports she is now taking Extra strength tylenol and Aleve (morning and night).  Main complaint is tightness in knee, but she is pleased with reduction in calf tightness.    Currently in Pain? No/denies    Pain Score 0-No pain    Pain Location Knee    Pain Orientation Right              [facility] PT Assessment - 02/05/20 0001      Assessment   Medical Diagnosis s/p R TKR 01/17/20    Referring Provider (PT) Jean Rosenthal, MD    Onset Date/Surgical Date 01/17/20    Prior Therapy none      PROM   Right Knee Extension -4    Right Knee Flexion 100   on bicycle          [facility] Adult PT  Treatment/Exercise - 02/05/20 0001      Knee/Hip Exercises: Stretches   Passive Hamstring Stretch Limitations single leg Long sit x 90 sec    Hip Flexor Stretch Right;1 rep;30 seconds    Gastroc Stretch Both;2 reps;30 seconds   incline board     Knee/Hip Exercises: Aerobic   Recumbent Bike Partial revolutions for Rt knee ROM x 6 min     Other Aerobic single laps in between exercises to reduce stiffness.       Knee/Hip Exercises: Standing   Heel Raises Both;1 set;10 reps    Terminal Knee Extension Right;1 set;10 reps    Theraband Level (Terminal Knee Extension) Level 3 (Green)    Forward Step Up Right;1 set;10 reps;Hand Hold: 2;Step Height: 6"    Step Down Left;1 set;10 reps;Hand Hold: 2;Step Height: 4"   and retro step up with RLE   Wall Squat 1 set;10 reps;5 seconds   45 deg knee bend   SLS Rt SLS on blue pad x 20 sec x 2 reps    Gait Training retro gait working on Rt knee ext with light UE support  on counter x 8 ft x 8 reps      Vasopneumatic   Number Minutes Vasopneumatic  10 minutes    Vasopnuematic Location  Knee   Rt   Vasopneumatic Pressure Medium    Vasopneumatic Temperature  34 deg      Manual Therapy   Passive ROM PT assist into knee ext w/ heel propped on roll 10 sec hold x 3 reps                        PT Long Term Goals - 01/23/20 1822      PT LONG TERM GOAL #1   Title The patient will be indep with HEP.    Time 6    Period Weeks    Target Date 03/05/20      PT LONG TERM GOAL #2   Title The patient will reduce functional limitation per FOTO to < or equal to 19%.    Baseline 60% limited    Time 6    Period Weeks    Target Date 03/05/20      PT LONG TERM GOAL #3   Title The patient will improve gait speed to > 3.0 ft/sec without a device independently.    Baseline 1.32 ft/sec    Time 6    Period Weeks    Target Date 03/05/20      PT LONG TERM GOAL #4   Title The patient will imrpove AROM R knee flexion from 76 degrees to > or equal to 105  degrees.    Time 6    Period Weeks    Target Date 03/05/20      PT LONG TERM GOAL #5   Title The patient will improve AROM R knee extension from -12 degrees to - 3 degrees.    Time 6    Period Weeks    Target Date 03/05/20      Additional Long Term Goals   Additional Long Term Goals Yes      PT LONG TERM GOAL #6   Title The patient will negotiate 4 steps without a handrail with reciprocal pattern indep    Time 6    Period Weeks    Target Date 03/05/20                 Plan - 02/05/20 1241    Clinical Impression Statement Pt demonstrating improved Rt knee ROM. Pt now ambulating without AD, without difficulty.  She tolerated all exercises well, without increase in pain.  Progressing well towards LTGs.    Rehab Potential Good    PT Frequency 2x / week    PT Duration 6 weeks    PT Treatment/Interventions ADLs/Self Care Home Management;Gait training;Stair training;Therapeutic activities;Therapeutic exercise;Neuromuscular re-education;Manual techniques;Taping;Patient/family education;Dry needling;Passive range of motion;Electrical Stimulation;Moist Heat;Cryotherapy;Vasopneumatic Device    PT Next Visit Plan LE strengthening progression, ROM,  gait training, vaso to reduce edema    PT Home Exercise Plan Access Code: [facility]    Consulted and Agree with Plan of Care Patient           Patient will benefit from skilled therapeutic intervention in order to improve the following deficits and impairments:  Abnormal gait, Decreased range of motion, Decreased strength, Pain, Increased edema, Decreased scar mobility, Impaired flexibility, Increased fascial restricitons  Visit Diagnosis: Acute pain of right knee  Muscle weakness (generalized)  Localized edema  Other abnormalities of gait and mobility     Problem List Patient Active Problem  List   Diagnosis Date Noted  . Status post total right knee replacement 01/17/2020  . Unilateral primary osteoarthritis, right knee  11/18/2019  . Basal cell carcinoma of left cheek 02/08/2019  . Changing skin lesion 12/21/2018  . History of basal cell carcinoma (BCC) 12/21/2018  . Chronic pain of right knee 09/06/2017   Kerin Perna, PTA 02/05/20 12:53 PM  [place], [place], [address] Phone: [phone]   Fax:  [phone]  Name: Jenny Baker MRN: [id number] Date of Birth: [birth year]

## 2020-02-07 ENCOUNTER — Ambulatory Visit (INDEPENDENT_AMBULATORY_CARE_PROVIDER_SITE_OTHER): Payer: 59 | Admitting: Physical Therapy

## 2020-02-07 ENCOUNTER — Other Ambulatory Visit: Payer: Self-pay

## 2020-02-07 DIAGNOSIS — R2689 Other abnormalities of gait and mobility: Secondary | ICD-10-CM | POA: Diagnosis not present

## 2020-02-07 DIAGNOSIS — M6281 Muscle weakness (generalized): Secondary | ICD-10-CM | POA: Diagnosis not present

## 2020-02-07 DIAGNOSIS — M25561 Pain in right knee: Secondary | ICD-10-CM | POA: Diagnosis not present

## 2020-02-07 DIAGNOSIS — R600 Localized edema: Secondary | ICD-10-CM | POA: Diagnosis not present

## 2020-02-07 NOTE — Therapy (Signed)
[place], [place], [address] Phone: [phone]   Fax:  [phone]  Physical Therapy Treatment  Patient Details  Name: Jenny Baker MRN: [id number] Date of Birth: [birth year] Referring Provider (PT): Jean Rosenthal, MD   Encounter Date: 02/07/2020   PT End of Session - 02/07/20 1057    Visit Number 5    Number of Visits 12    Date for PT Re-Evaluation 03/05/20    Authorization Type aetna    PT Start Time 1018    PT Stop Time 1102    PT Time Calculation (min) 44 min    Activity Tolerance Patient tolerated treatment well    Behavior During Therapy [facility] for tasks assessed/performed           Past Medical History:  Diagnosis Date  . Arthritis   . Cancer (HCC)    Basal cell on L side of nose  . Headache    Related to previous concussion   . Vertigo    Current episode 5-21    Past Surgical History:  Procedure Laterality Date  . APPENDECTOMY    . CESAREAN SECTION    . KNEE ARTHROSCOPY    . TOTAL KNEE ARTHROPLASTY Right 01/17/2020   Procedure: RIGHT TOTAL KNEE ARTHROPLASTY;  Surgeon: Mcarthur Rossetti, MD;  Location: [facility];  Service: Orthopedics;  Laterality: Right;    There were no vitals filed for this visit.   Subjective Assessment - 02/07/20 1026    Subjective Pt reports she has been doing a lot of walking in neighborhood.  She brought a walking pole with her, wondered if she needs to use it anymore. "I feel ready to walk without anything".    Currently in Pain? No/denies    Pain Score 0-No pain              [facility] PT Assessment - 02/07/20 0001      Assessment   Medical Diagnosis s/p R TKR 01/17/20    Referring Provider (PT) Jean Rosenthal, MD    Onset Date/Surgical Date 01/17/20    Next MD Visit 03/02/20    Prior Therapy none            [facility] Adult PT Treatment/Exercise - 02/07/20 0001      Knee/Hip Exercises: Stretches   Passive Hamstring Stretch  Right;2 reps;30 seconds   supine with overpressure   Passive Hamstring Stretch Limitations Rt foot on 12" step, 10 sec hold with overpressure x 4 reps    Quad Stretch Right;2 reps;30 seconds   prone with strap   Knee: Self-Stretch to increase Flexion Right;10 seconds;4 reps   foot on 12" step, lunging forward with BUE support   ITB Stretch Right;2 reps;20 seconds   supine with strap   Gastroc Stretch Both;2 reps;30 seconds   incline board   Other Knee/Hip Stretches supine adductor stretch RLE, with strap x 20 sec x 2 reps      Knee/Hip Exercises: Aerobic   Recumbent Bike Partial to slow full revolutions for Rt knee ROM x 6 min     Other Aerobic single laps in between exercises to reduce stiffness.       Knee/Hip Exercises: Standing   Terminal Knee Extension Right;1 set;10 reps    Theraband Level (Terminal Knee Extension) Level 3 (Green)    Forward Step Up Right;1 set;10 reps;Hand Hold: 2;Step Height: 6"    Step Down Left;1 set;10 reps;Hand Hold: 2;Step Height: 4"   and retro step  up with RLE     Knee/Hip Exercises: Prone   Hamstring Curl 5 reps    Prone Knee Hang 1 minute      Vasopneumatic   Number Minutes Vasopneumatic  10 minutes    Vasopnuematic Location  Knee   Rt   Vasopneumatic Pressure Medium    Vasopneumatic Temperature  34 deg      Manual Therapy   Manual Therapy Soft tissue mobilization    Soft tissue mobilization STM to Rt calf and hamstring     Passive ROM PT assist into knee ext w/ heel propped on roll 10 sec hold x 3 reps                        PT Long Term Goals - 02/07/20 1058      PT LONG TERM GOAL #1   Title The patient will be indep with HEP.    Time 6    Period Weeks      PT LONG TERM GOAL #2   Title The patient will reduce functional limitation per FOTO to < or equal to 19%.    Baseline 60% limited    Time 6    Period Weeks    Status On-going      PT LONG TERM GOAL #3   Title The patient will improve gait speed to > 3.0 ft/sec  without a device independently.    Baseline --    Time 6    Period Weeks    Status Partially Met      PT LONG TERM GOAL #4   Title The patient will imrpove AROM R knee flexion from 76 degrees to > or equal to 105 degrees.    Time 6    Period Weeks    Status On-going      PT LONG TERM GOAL #5   Title The patient will improve AROM R knee extension from -12 degrees to - 3 degrees.    Time 6    Period Weeks    Status On-going      PT LONG TERM GOAL #6   Title The patient will negotiate 4 steps without a handrail with reciprocal pattern indep    Time 6    Period Weeks    Status Achieved                 Plan - 02/07/20 1617    Clinical Impression Statement Pt shown additional exercises to work on Rt knee flexion/ext ROM; tolerated all very well.  She continues to make good gains with each visit.    Rehab Potential Good    PT Frequency 2x / week    PT Duration 6 weeks    PT Treatment/Interventions ADLs/Self Care Home Management;Gait training;Stair training;Therapeutic activities;Therapeutic exercise;Neuromuscular re-education;Manual techniques;Taping;Patient/family education;Dry needling;Passive range of motion;Electrical Stimulation;Moist Heat;Cryotherapy;Vasopneumatic Device    PT Next Visit Plan LE strengthening progression, ROM,  gait training, vaso to reduce edema    PT Home Exercise Plan Access Code: [place] and Agree with Plan of Care Patient           Patient will benefit from skilled therapeutic intervention in order to improve the following deficits and impairments:  Abnormal gait, Decreased range of motion, Decreased strength, Pain, Increased edema, Decreased scar mobility, Impaired flexibility, Increased fascial restricitons  Visit Diagnosis: Acute pain of right knee  Muscle weakness (generalized)  Localized edema  Other abnormalities of gait and mobility  Problem List Patient Active Problem List   Diagnosis Date Noted  . Status  post total right knee replacement 01/17/2020  . Unilateral primary osteoarthritis, right knee 11/18/2019  . Basal cell carcinoma of left cheek 02/08/2019  . Changing skin lesion 12/21/2018  . History of basal cell carcinoma (BCC) 12/21/2018  . Chronic pain of right knee 09/06/2017   Kerin Perna, PTA 02/07/20 6:09 PM  [place], [place], [address] Phone: [phone]   Fax:  [phone]  Name: Jenny Baker MRN: [id number] Date of Birth: 02-24-1961

## 2020-02-11 ENCOUNTER — Encounter: Payer: Self-pay | Admitting: Rehabilitative and Restorative Service Providers"

## 2020-02-11 ENCOUNTER — Ambulatory Visit (INDEPENDENT_AMBULATORY_CARE_PROVIDER_SITE_OTHER): Payer: 59 | Admitting: Rehabilitative and Restorative Service Providers"

## 2020-02-11 ENCOUNTER — Other Ambulatory Visit: Payer: Self-pay

## 2020-02-11 DIAGNOSIS — M25561 Pain in right knee: Secondary | ICD-10-CM

## 2020-02-11 DIAGNOSIS — M6281 Muscle weakness (generalized): Secondary | ICD-10-CM

## 2020-02-11 DIAGNOSIS — R600 Localized edema: Secondary | ICD-10-CM | POA: Diagnosis not present

## 2020-02-11 DIAGNOSIS — R2689 Other abnormalities of gait and mobility: Secondary | ICD-10-CM

## 2020-02-11 NOTE — Patient Instructions (Signed)
Access Code: [facility] URL: [email or web address] Date: 02/11/2020 Prepared by: Rudell Cobb  Exercises Quad Setting and Stretching - 2 x daily - 7 x weekly - 1 sets - 10 reps - 5 seconds hold Active Straight Leg Raise with Quad Set - 2 x daily - 7 x weekly - 1 sets - 5-10 reps Sidelying Hip Abduction - 2 x daily - 7 x weekly - 1 sets - 10 reps Supine Knee Extension Strengthening - 2 x daily - 7 x weekly - 1 sets - 10 reps - 5-10 sec hold Supine Knee Flexion AAROM at Wall - 2 x daily - 7 x weekly - 1 sets - 3 reps - 30-60 seconds hold Seated Long Arc Quad - 2 x daily - 7 x weekly - 1 sets - 10 reps Seated Hamstring Stretch - 2 x daily - 7 x weekly - 1 sets - 3 reps - 30 sec hold Gastroc Stretch on Wall - 2 x daily - 7 x weekly - 1 sets - 3 reps - 20-30 seconds hold Soleus Stretch on Wall - 2 x daily - 7 x weekly - 1 sets - 3 reps - 30 sec hold Standing Terminal Knee Extension at Wall with Ball - 2 x daily - 7 x weekly - 1 sets - 10 reps - 5 sec hold

## 2020-02-11 NOTE — Therapy (Signed)
[place], [place], [address] Phone: [phone]   Fax:  [phone]  Physical Therapy Treatment  Patient Details  Name: Jenny Baker MRN: [id number] Date of Birth: July 02, 1961 Referring Provider (PT): Jean Rosenthal, MD   Encounter Date: 02/11/2020   PT End of Session - 02/11/20 1111    Visit Number 6    Number of Visits 12    Date for PT Re-Evaluation 03/05/20    Authorization Type aetna    PT Start Time 1107    PT Stop Time 1152    PT Time Calculation (min) 45 min    Activity Tolerance Patient tolerated treatment well    Behavior During Therapy [facility] for tasks assessed/performed           Past Medical History:  Diagnosis Date  . Arthritis   . Cancer (HCC)    Basal cell on L side of nose  . Headache    Related to previous concussion   . Vertigo    Current episode 5-21    Past Surgical History:  Procedure Laterality Date  . APPENDECTOMY    . CESAREAN SECTION    . KNEE ARTHROSCOPY    . TOTAL KNEE ARTHROPLASTY Right 01/17/2020   Procedure: RIGHT TOTAL KNEE ARTHROPLASTY;  Surgeon: Mcarthur Rossetti, MD;  Location: [facility];  Service: Orthopedics;  Laterality: Right;    There were no vitals filed for this visit.   Subjective Assessment - 02/11/20 1108    Subjective The patient cocntinues with tightness in the right knee.  She does not get pain regularly, unless on her feet for long periods (gets medial distal knee pain).    Patient Stated Goals Walk normally, be able to hike without pain.    Currently in Pain? No/denies              [facility] PT Assessment - 02/11/20 1111      Assessment   Medical Diagnosis s/p R TKR 01/17/20    Referring Provider (PT) Jean Rosenthal, MD    Onset Date/Surgical Date 01/17/20      PROM   Overall PROM  Deficits    Right Knee Extension -6    Right Knee Flexion 106                         [facility] Adult PT  Treatment/Exercise - 02/11/20 1111      Ambulation/Gait   Ambulation/Gait Yes    Ambulation/Gait Assistance 7: Independent    Ambulation Distance (Feet) 300 Feet    Assistive device None    Gait Pattern Step-through pattern;Decreased stance time - right;Decreased weight shift to right   dec'd knee ext at terminal stance   Ambulation Surface Level;Indoor      Exercises   Exercises Knee/Hip      Knee/Hip Exercises: Stretches   Active Hamstring Stretch Right;2 reps;30 seconds    Knee: Self-Stretch to increase Flexion Right;2 reps;30 seconds      Knee/Hip Exercises: Aerobic   Recumbent Bike patient is able to get full revolution on the bike today x 4 minutes without resistance      Knee/Hip Exercises: Standing   Heel Raises Limitations heel walking x 20 feet x 2 reps/ toe walking 20 feet x 2 reps    Knee Flexion Strengthening;Right;10 reps    Knee Flexion Limitations performed with anterior weight shift onto L LE and then lifting R leg into knee flexion  Hip Flexion Stengthening;Both;10 reps    Hip Flexion Limitations as march with high  marches and working to "kick heel toward your bottom"    Step Down Left;1 set;10 reps    SLS R SLS on blue foam x 20 seconds    SLS with Vectors SLS with lateral reaching for vectors on blue foam    Other Standing Knee Exercises standing in stride position performing anterior/upward reaching to extend R kne (posterior) and lengthen through trunk    Other Standing Knee Exercises standing vectors in single leg stance on solid ground anterior/posterior      Knee/Hip Exercises: Supine   Quad Sets Strengthening;Right;10 reps      Knee/Hip Exercises: Prone   Prone Knee Hang 2 minutes    Prone Knee Hang Limitations contract/relax end range      Modalities   Modalities Vasopneumatic      Vasopneumatic   Number Minutes Vasopneumatic  10 minutes    Vasopnuematic Location  Knee    Vasopneumatic Pressure Medium    Vasopneumatic Temperature  34 deg       Manual Therapy   Manual Therapy Passive ROM    Passive ROM end range extension; PROM end range movement flexion/extension                  PT Education - 02/11/20 1304    Education Details modified AAROM knee flexion to be supine with wall slide to use gravity to assist motion    Person(s) Educated Patient    Methods Explanation;Demonstration;Handout    Comprehension Returned demonstration;Verbalized understanding               PT Long Term Goals - 02/07/20 1058      PT LONG TERM GOAL #1   Title The patient will be indep with HEP.    Time 6    Period Weeks      PT LONG TERM GOAL #2   Title The patient will reduce functional limitation per FOTO to < or equal to 19%.    Baseline 60% limited    Time 6    Period Weeks    Status On-going      PT LONG TERM GOAL #3   Title The patient will improve gait speed to > 3.0 ft/sec without a device independently.    Baseline --    Time 6    Period Weeks    Status Partially Met      PT LONG TERM GOAL #4   Title The patient will imrpove AROM R knee flexion from 76 degrees to > or equal to 105 degrees.    Time 6    Period Weeks    Status On-going      PT LONG TERM GOAL #5   Title The patient will improve AROM R knee extension from -12 degrees to - 3 degrees.    Time 6    Period Weeks    Status On-going      PT LONG TERM GOAL #6   Title The patient will negotiate 4 steps without a handrail with reciprocal pattern indep    Time 6    Period Weeks    Status Achieved                 Plan - 02/11/20 1305    Clinical Impression Statement The patient continues to increase flexion and tolerance to weight shifting.  PT to continue to work on normalizing gait, LE strengthening, and returning to prior  functional status.    PT Treatment/Interventions ADLs/Self Care Home Management;Gait training;Stair training;Therapeutic activities;Therapeutic exercise;Neuromuscular re-education;Manual techniques;Taping;Patient/family  education;Dry needling;Passive range of motion;Electrical Stimulation;Moist Heat;Cryotherapy;Vasopneumatic Device    PT Next Visit Plan LE strengthening progression, ROM,  gait training, vaso to reduce edema    PT Home Exercise Plan Access Code: [facility]    Consulted and Agree with Plan of Care Patient           Patient will benefit from skilled therapeutic intervention in order to improve the following deficits and impairments:  Abnormal gait, Decreased range of motion, Decreased strength, Pain, Increased edema, Decreased scar mobility, Impaired flexibility, Increased fascial restricitons  Visit Diagnosis: Acute pain of right knee  Muscle weakness (generalized)  Localized edema  Other abnormalities of gait and mobility     Problem List Patient Active Problem List   Diagnosis Date Noted  . Status post total right knee replacement 01/17/2020  . Unilateral primary osteoarthritis, right knee 11/18/2019  . Basal cell carcinoma of left cheek 02/08/2019  . Changing skin lesion 12/21/2018  . History of basal cell carcinoma (BCC) 12/21/2018  . Chronic pain of right knee 09/06/2017    Eraina Winnie, PT 02/11/2020, 1:29 PM  [facility] [place], [place], [address] Phone: [phone]   Fax:  [phone]  Name: Jenny Baker MRN: [id number] Date of Birth: September 13, 1960

## 2020-02-14 ENCOUNTER — Other Ambulatory Visit: Payer: Self-pay

## 2020-02-14 ENCOUNTER — Ambulatory Visit (INDEPENDENT_AMBULATORY_CARE_PROVIDER_SITE_OTHER): Payer: 59 | Admitting: Physical Therapy

## 2020-02-14 DIAGNOSIS — R600 Localized edema: Secondary | ICD-10-CM

## 2020-02-14 DIAGNOSIS — M25561 Pain in right knee: Secondary | ICD-10-CM | POA: Diagnosis not present

## 2020-02-14 DIAGNOSIS — R2689 Other abnormalities of gait and mobility: Secondary | ICD-10-CM

## 2020-02-14 DIAGNOSIS — M6281 Muscle weakness (generalized): Secondary | ICD-10-CM | POA: Diagnosis not present

## 2020-02-14 NOTE — Therapy (Signed)
[place], [place], [address] Phone: [phone]   Fax:  [phone]  Physical Therapy Treatment  Patient Details  Name: Jenny Baker MRN: [id number] Date of Birth: March 31, 1961 Referring Provider (PT): Jean Rosenthal, MD   Encounter Date: 02/14/2020   PT End of Session - 02/14/20 1107    Visit Number 7    Number of Visits 12    Date for PT Re-Evaluation 03/05/20    Authorization Type aetna    PT Start Time 1102    PT Stop Time 1145    PT Time Calculation (min) 43 min    Activity Tolerance Patient tolerated treatment well    Behavior During Therapy [facility] for tasks assessed/performed           Past Medical History:  Diagnosis Date  . Arthritis   . Cancer (HCC)    Basal cell on L side of nose  . Headache    Related to previous concussion   . Vertigo    Current episode 5-21    Past Surgical History:  Procedure Laterality Date  . APPENDECTOMY    . CESAREAN SECTION    . KNEE ARTHROSCOPY    . TOTAL KNEE ARTHROPLASTY Right 01/17/2020   Procedure: RIGHT TOTAL KNEE ARTHROPLASTY;  Surgeon: Mcarthur Rossetti, MD;  Location: [facility];  Service: Orthopedics;  Laterality: Right;    There were no vitals filed for this visit.   Subjective Assessment - 02/14/20 1104    Subjective Pt reports she had a busy day yesterday and had her knee in dependent position. Her knee swelled. In the night her knee woke her up with a jerk motion.    Patient Stated Goals Walk normally, be able to hike without pain.    Currently in Pain? No/denies    Pain Score 0-No pain              [facility] PT Assessment - 02/14/20 0001      Assessment   Medical Diagnosis s/p R TKR 01/17/20    Referring Provider (PT) Jean Rosenthal, MD    Onset Date/Surgical Date 01/17/20    Next MD Visit 03/02/20            [facility] Adult PT Treatment/Exercise - 02/14/20 0001      Knee/Hip Exercises: Stretches   Passive  Hamstring Stretch Right;3 reps;30 seconds    Hip Flexor Stretch Right;2 reps;30 seconds   seated with leg back    Gastroc Stretch Both;1 rep;30 seconds      Knee/Hip Exercises: Aerobic   Tread Mill 3.5 min at -1.0 mph (backwards) working on hip and knee ext     Recumbent Bike slow full revolutions to L1 x 5 min       Knee/Hip Exercises: Standing   Lateral Step Up Right;1 set;10 reps   onto Bosu, with intermittent UE support to steady.    Step Down Left;1 set;10 reps;Hand Hold: 0;Step Height: 6"   and retro step up   SLS Rt SLS forward leans to touch Lt hand to chair seat x 10    SLS with Vectors Rt SLS on blue pad with mini squats and Lt toe taps front, side, back x 5 reps    Other Standing Knee Exercises heel/ toe walking 20 ft (10 ft R/L) without UE support.     Other Standing Knee Exercises partial split squats x 10 each leg forward, light UE support on counter.  Vasopneumatic   Number Minutes Vasopneumatic  10 minutes    Vasopnuematic Location  Knee    Vasopneumatic Pressure Low    Vasopneumatic Temperature  34 deg      Manual Therapy   Manual therapy comments I strip of reg rock tape applied to ant Rt knee in zig zag pattern over incision to assist with scar management.    Soft tissue mobilization IASTM and STM to Rt calf and hamstring to decrease fascial restrictions - (performed with pt's foot off of mat / prone hang)                       PT Long Term Goals - 02/07/20 1058      PT LONG TERM GOAL #1   Title The patient will be indep with HEP.    Time 6    Period Weeks      PT LONG TERM GOAL #2   Title The patient will reduce functional limitation per FOTO to < or equal to 19%.    Baseline 60% limited    Time 6    Period Weeks    Status On-going      PT LONG TERM GOAL #3   Title The patient will improve gait speed to > 3.0 ft/sec without a device independently.    Baseline --    Time 6    Period Weeks    Status Partially Met      PT LONG TERM  GOAL #4   Title The patient will imrpove AROM R knee flexion from 76 degrees to > or equal to 105 degrees.    Time 6    Period Weeks    Status On-going      PT LONG TERM GOAL #5   Title The patient will improve AROM R knee extension from -12 degrees to - 3 degrees.    Time 6    Period Weeks    Status On-going      PT LONG TERM GOAL #6   Title The patient will negotiate 4 steps without a handrail with reciprocal pattern indep    Time 6    Period Weeks    Status Achieved                 Plan - 02/14/20 1649    Clinical Impression Statement Pt able to turn bicycle on today after a few slow full revolutions.  She tolerated all single leg standing balance exercises well, without any increase in symptoms.  Trial of rock tape applied to knee incision for scar management. Progressing well towards goals.    PT Treatment/Interventions ADLs/Self Care Home Management;Gait training;Stair training;Therapeutic activities;Therapeutic exercise;Neuromuscular re-education;Manual techniques;Taping;Patient/family education;Dry needling;Passive range of motion;Electrical Stimulation;Moist Heat;Cryotherapy;Vasopneumatic Device    PT Next Visit Plan LE strengthening progression, ROM,  gait training, vaso to reduce edema    PT Home Exercise Plan Access Code: [facility]    Consulted and Agree with Plan of Care Patient           Patient will benefit from skilled therapeutic intervention in order to improve the following deficits and impairments:  Abnormal gait, Decreased range of motion, Decreased strength, Pain, Increased edema, Decreased scar mobility, Impaired flexibility, Increased fascial restricitons  Visit Diagnosis: Acute pain of right knee  Muscle weakness (generalized)  Localized edema  Other abnormalities of gait and mobility     Problem List Patient Active Problem List   Diagnosis Date Noted  . Status post total right  knee replacement 01/17/2020  . Unilateral primary  osteoarthritis, right knee 11/18/2019  . Basal cell carcinoma of left cheek 02/08/2019  . Changing skin lesion 12/21/2018  . History of basal cell carcinoma (BCC) 12/21/2018  . Chronic pain of right knee 09/06/2017   Kerin Perna, PTA 02/14/20 4:53 PM [place], [place], [address] Phone: [phone]   Fax:  [phone]  Name: Jenny Baker MRN: [id number] Date of Birth: 09/17/1960

## 2020-02-18 ENCOUNTER — Other Ambulatory Visit: Payer: Self-pay

## 2020-02-18 ENCOUNTER — Ambulatory Visit (INDEPENDENT_AMBULATORY_CARE_PROVIDER_SITE_OTHER): Payer: 59 | Admitting: Physical Therapy

## 2020-02-18 DIAGNOSIS — M25561 Pain in right knee: Secondary | ICD-10-CM | POA: Diagnosis not present

## 2020-02-18 DIAGNOSIS — R2689 Other abnormalities of gait and mobility: Secondary | ICD-10-CM

## 2020-02-18 DIAGNOSIS — R600 Localized edema: Secondary | ICD-10-CM | POA: Diagnosis not present

## 2020-02-18 DIAGNOSIS — M6281 Muscle weakness (generalized): Secondary | ICD-10-CM | POA: Diagnosis not present

## 2020-02-18 NOTE — Therapy (Signed)
[place], [place], [address] Phone: [phone]   Fax:  [phone]  Physical Therapy Treatment  Patient Details  Name: Jenny Baker MRN: [id number] Date of Birth: 1960-09-22 Referring Provider (PT): Jean Rosenthal, MD   Encounter Date: 02/18/2020   PT End of Session - 02/18/20 1108    Visit Number 8    Number of Visits 12    Date for PT Re-Evaluation 03/05/20    Authorization Type aetna    PT Start Time 1058    PT Stop Time 1148    PT Time Calculation (min) 50 min    Activity Tolerance Patient tolerated treatment well    Behavior During Therapy [facility] for tasks assessed/performed           Past Medical History:  Diagnosis Date  . Arthritis   . Cancer (HCC)    Basal cell on L side of nose  . Headache    Related to previous concussion   . Vertigo    Current episode 5-21    Past Surgical History:  Procedure Laterality Date  . APPENDECTOMY    . CESAREAN SECTION    . KNEE ARTHROSCOPY    . TOTAL KNEE ARTHROPLASTY Right 01/17/2020   Procedure: RIGHT TOTAL KNEE ARTHROPLASTY;  Surgeon: Mcarthur Rossetti, MD;  Location: [facility];  Service: Orthopedics;  Laterality: Right;    There were no vitals filed for this visit.   Subjective Assessment - 02/18/20 1109    Subjective Pt reports a pain in her Rt medial knee when working on flexing knee.  Sleep is still disturbed in middle of night, but is able to sleep on side now.    Currently in Pain? No/denies    Pain Score 0-No pain              [facility] PT Assessment - 02/18/20 0001      Assessment   Medical Diagnosis s/p R TKR 01/17/20    Referring Provider (PT) Jean Rosenthal, MD    Onset Date/Surgical Date 01/17/20    Next MD Visit 03/02/20      PROM   Right Knee Flexion 113           [facility] Adult PT Treatment/Exercise - 02/18/20 0001      Knee/Hip Exercises: Stretches   Passive Hamstring Stretch Right;30 seconds;4 reps    overpressure from pt   Hip Flexor Stretch Right;1 rep;60 seconds   Thomas position   Knee: Self-Stretch to increase Flexion Right;3 reps;20 seconds   forward lunge with foot on 12" step    Gastroc Stretch Both;30 seconds      Knee/Hip Exercises: Aerobic   Elliptical L1: 2.5 min, and backwards x 1 min    Tread Mill 1 min at -1.0 (retro) for focus on knee/hip ext.     Recumbent Bike L1: 3.5 min       Knee/Hip Exercises: Standing   Terminal Knee Extension Right;10 reps   ball behind knee x 10 sec holds   Lateral Step Up Right;1 set;10 reps   onto Bosu, with intermittent UE support to steady.    SLS Rt SLS forward leans to touch Lt hand to chair seat x 10;  30 sec Rt SLS on bosu, 20 sec on upside down bosu with intermittent UE to steady.     Other Standing Knee Exercises tandem walk 18 ft forward/ backward without UE support, cues for slow controlled motion.     Other Standing Knee  Exercises partial split squats x 10 each leg forward, light UE support on counter.       Knee/Hip Exercises: Seated   Sit to Sand 10 reps;without UE support   Rt foot slightly back     Vasopneumatic   Number Minutes Vasopneumatic  10 minutes    Vasopnuematic Location  Knee    Vasopneumatic Pressure Medium    Vasopneumatic Temperature  34 deg      Manual Therapy   Soft tissue mobilization IASTM to Rt medial knee, including patellar tendon to decrease fascial restrictions and pain.                        PT Long Term Goals - 02/18/20 1145      PT LONG TERM GOAL #1   Title The patient will be indep with HEP.    Time 6    Period Weeks    Status On-going      PT LONG TERM GOAL #2   Title The patient will reduce functional limitation per FOTO to < or equal to 19%.    Baseline 60% limited    Time 6    Period Weeks    Status On-going      PT LONG TERM GOAL #3   Title The patient will improve gait speed to > 3.0 ft/sec without a device independently.    Time 6    Period Weeks    Status  Partially Met      PT LONG TERM GOAL #4   Title The patient will imrpove AROM R knee flexion from 76 degrees to > or equal to 105 degrees.    Time 6    Period Weeks    Status Achieved      PT LONG TERM GOAL #5   Title The patient will improve AROM R knee extension from -12 degrees to - 3 degrees.    Time 6    Period Weeks    Status On-going      PT LONG TERM GOAL #6   Title The patient will negotiate 4 steps without a handrail with reciprocal pattern indep    Time 6    Period Weeks    Status Achieved                 Plan - 02/18/20 1146    Clinical Impression Statement Pt tolerated bicycle and eliptical well today.  Rt knee flexion ROM progressing well; has met LTG#4.  She tolerated all exercises well with minimal  increase in pain.    PT Treatment/Interventions ADLs/Self Care Home Management;Gait training;Stair training;Therapeutic activities;Therapeutic exercise;Neuromuscular re-education;Manual techniques;Taping;Patient/family education;Dry needling;Passive range of motion;Electrical Stimulation;Moist Heat;Cryotherapy;Vasopneumatic Device    PT Next Visit Plan LE strengthening progression, ROM,  gait training, vaso to reduce edema    PT Home Exercise Plan Access Code: [facility]    Consulted and Agree with Plan of Care Patient           Patient will benefit from skilled therapeutic intervention in order to improve the following deficits and impairments:  Abnormal gait, Decreased range of motion, Decreased strength, Pain, Increased edema, Decreased scar mobility, Impaired flexibility, Increased fascial restricitons  Visit Diagnosis: Acute pain of right knee  Muscle weakness (generalized)  Localized edema  Other abnormalities of gait and mobility     Problem List Patient Active Problem List   Diagnosis Date Noted  . Status post total right knee replacement 01/17/2020  . Unilateral primary osteoarthritis, right  knee 11/18/2019  . Basal cell carcinoma of left  cheek 02/08/2019  . Changing skin lesion 12/21/2018  . History of basal cell carcinoma (BCC) 12/21/2018  . Chronic pain of right knee 09/06/2017   Kerin Perna, PTA 02/18/20 12:36 PM  [place], [place], [address] Phone: [phone]   Fax:  [phone]  Name: Jenny Baker MRN: [id number] Date of Birth: 08-Dec-1960

## 2020-02-21 ENCOUNTER — Other Ambulatory Visit: Payer: Self-pay

## 2020-02-21 ENCOUNTER — Ambulatory Visit (INDEPENDENT_AMBULATORY_CARE_PROVIDER_SITE_OTHER): Payer: 59 | Admitting: Physical Therapy

## 2020-02-21 DIAGNOSIS — M6281 Muscle weakness (generalized): Secondary | ICD-10-CM

## 2020-02-21 DIAGNOSIS — R600 Localized edema: Secondary | ICD-10-CM

## 2020-02-21 DIAGNOSIS — R2689 Other abnormalities of gait and mobility: Secondary | ICD-10-CM

## 2020-02-21 DIAGNOSIS — M25561 Pain in right knee: Secondary | ICD-10-CM

## 2020-02-21 NOTE — Therapy (Signed)
[place], [place], [address] Phone: [phone]   Fax:  [phone]  Physical Therapy Treatment  Patient Details  Name: Jenny Baker MRN: [id number] Date of Birth: 1960-10-01 Referring Provider (PT): Jean Rosenthal, MD   Encounter Date: 02/21/2020   PT End of Session - 02/21/20 1013    Visit Number 9    Number of Visits 12    Date for PT Re-Evaluation 03/05/20    Authorization Type aetna    PT Start Time 0930    PT Stop Time 1021    PT Time Calculation (min) 51 min    Activity Tolerance Patient tolerated treatment well;No increased pain    Behavior During Therapy WFL for tasks assessed/performed           Past Medical History:  Diagnosis Date  . Arthritis   . Cancer (HCC)    Basal cell on L side of nose  . Headache    Related to previous concussion   . Vertigo    Current episode 5-21    Past Surgical History:  Procedure Laterality Date  . APPENDECTOMY    . CESAREAN SECTION    . KNEE ARTHROSCOPY    . TOTAL KNEE ARTHROPLASTY Right 01/17/2020   Procedure: RIGHT TOTAL KNEE ARTHROPLASTY;  Surgeon: Mcarthur Rossetti, MD;  Location: [facility];  Service: Orthopedics;  Laterality: Right;    There were no vitals filed for this visit.   Subjective Assessment - 02/21/20 0936    Subjective Pt reports she did a lot of sitting for meetings yesterday; increased Rt knee stiffness.  She stretched and took Robaxin at bed and this helped.    Patient Stated Goals Walk normally, be able to hike without pain.    Currently in Pain? No/denies    Pain Score 0-No pain              [facility] PT Assessment - 02/21/20 0001      Assessment   Medical Diagnosis s/p R TKR 01/17/20    Referring Provider (PT) Jean Rosenthal, MD    Onset Date/Surgical Date 01/17/20    Next MD Visit 03/02/20      AROM   Right/Left Knee Right    Right Knee Extension -5   supine quad set     Strength   Strength  Assessment Site Hip    Right/Left Hip Right    Right Hip Flexion 5/5    Right Hip Extension 4/5    Right Hip ABduction 4+/5    Right Hip ADduction 4+/5           [facility] Adult PT Treatment/Exercise - 02/21/20 0001      Knee/Hip Exercises: Stretches   Passive Hamstring Stretch Right;2 reps;30 seconds    Gastroc Stretch Right;2 reps;Left;1 rep;30 seconds      Knee/Hip Exercises: Aerobic   Elliptical L2: 4 min     Other Aerobic single laps (80 ft around gym)in between exercises to reduce stiffness.       Knee/Hip Exercises: Machines for Strengthening   Cybex Knee Extension BLE into ext, RLE eccentric lowering x 10 with 2 plates.     Cybex Leg Press BLE 6 plates x 5 reps, 15 reps with 8 plates.  RLE only with 5 plates x 10 reps   LLE able to push 7 plates comfortably.     Knee/Hip Exercises: Standing   Step Down Left;1 set;15 reps;Hand Hold: 0;Step Height: 6"    Step Down  Limitations retro step up with RLE x 12 reps, 6" step, RUE on rail.     Stairs reciprocal pattern, no rail x 13 steps       Knee/Hip Exercises: Supine   Single Leg Bridge Strengthening;Right;1 set;10 reps   Lt leg in fig 4     Knee/Hip Exercises: Sidelying   Hip ABduction Strengthening;Right;1 set;10 reps      Knee/Hip Exercises: Prone   Hamstring Curl 5 reps    Hamstring Curl Limitations in between prone hang sets    Prone Knee Hang 1 minute   2 reps   Prone Knee Hang Weights (lbs) 5      Vasopneumatic   Number Minutes Vasopneumatic  10 minutes    Vasopnuematic Location  Knee   Rt   Vasopneumatic Pressure Medium    Vasopneumatic Temperature  34 deg      Manual Therapy   Soft tissue mobilization IASTM and STM to Rt calf and hamstrings to decrease fascial restrictions and improve ROM.                        PT Long Term Goals - 02/18/20 1145      PT LONG TERM GOAL #1   Title The patient will be indep with HEP.    Time 6    Period Weeks    Status On-going      PT LONG TERM GOAL #2    Title The patient will reduce functional limitation per FOTO to < or equal to 19%.    Baseline 60% limited    Time 6    Period Weeks    Status On-going      PT LONG TERM GOAL #3   Title The patient will improve gait speed to > 3.0 ft/sec without a device independently.    Time 6    Period Weeks    Status Partially Met      PT LONG TERM GOAL #4   Title The patient will imrpove AROM R knee flexion from 76 degrees to > or equal to 105 degrees.    Time 6    Period Weeks    Status Achieved      PT LONG TERM GOAL #5   Title The patient will improve AROM R knee extension from -12 degrees to - 3 degrees.    Time 6    Period Weeks    Status On-going      PT LONG TERM GOAL #6   Title The patient will negotiate 4 steps without a handrail with reciprocal pattern indep    Time 6    Period Weeks    Status Achieved                 Plan - 02/21/20 1015    Clinical Impression Statement Improved Rt knee extension ROM. Continues with fascial restrictions in post LE; improved with STM.  Pt able to tolerate strength training with wt machines well, without increase in pain.  Making good gains towards remaining goals.    PT Treatment/Interventions ADLs/Self Care Home Management;Gait training;Stair training;Therapeutic activities;Therapeutic exercise;Neuromuscular re-education;Manual techniques;Taping;Patient/family education;Dry needling;Passive range of motion;Electrical Stimulation;Moist Heat;Cryotherapy;Vasopneumatic Device    PT Next Visit Plan LE strengthening progression, ROM,  gait training, vaso to reduce edema    PT Home Exercise Plan Access Code: [place] and Agree with Plan of Care Patient           Patient will benefit from skilled  therapeutic intervention in order to improve the following deficits and impairments:  Abnormal gait, Decreased range of motion, Decreased strength, Pain, Increased edema, Decreased scar mobility, Impaired flexibility, Increased fascial  restricitons  Visit Diagnosis: Acute pain of right knee  Muscle weakness (generalized)  Localized edema  Other abnormalities of gait and mobility     Problem List Patient Active Problem List   Diagnosis Date Noted  . Status post total right knee replacement 01/17/2020  . Unilateral primary osteoarthritis, right knee 11/18/2019  . Basal cell carcinoma of left cheek 02/08/2019  . Changing skin lesion 12/21/2018  . History of basal cell carcinoma (BCC) 12/21/2018  . Chronic pain of right knee 09/06/2017   Kerin Perna, PTA 02/21/20 10:23 AM  [facility], [place], [address] Phone: [phone]   Fax:  [phone]  Name: Aneth Schlagel MRN: [id number] Date of Birth: Jul 16, 1961

## 2020-03-02 ENCOUNTER — Ambulatory Visit: Payer: 59 | Admitting: Orthopaedic Surgery

## 2020-03-02 ENCOUNTER — Encounter: Payer: Self-pay | Admitting: Orthopaedic Surgery

## 2020-03-02 ENCOUNTER — Ambulatory Visit (INDEPENDENT_AMBULATORY_CARE_PROVIDER_SITE_OTHER): Payer: 59 | Admitting: Orthopaedic Surgery

## 2020-03-02 DIAGNOSIS — Z96651 Presence of right artificial knee joint: Secondary | ICD-10-CM

## 2020-03-02 NOTE — Progress Notes (Signed)
The patient is now 6-week status post a right total knee arthroplasty.  She is having some medial joint line tenderness but otherwise is doing well.  She is to work on range of motion.  She would like to return to work today.  She is not taking any narcotic pain medicines either.  She is walking without an assistive device.  On examination of her right operative knee there is still mild swelling to be expected.  She has almost full extension her flexion is to at least 115 degrees.  At this point she will continue to increase her activities as comfort allows and work on aggressive motion and quad strengthening.  I will give her a note to let her return to work today.  I would like to see her back in 3 months.  At that visit I like a standing AP and lateral of the right operative knee.  All questions and concerns were answered and addressed.

## 2020-03-03 ENCOUNTER — Encounter: Payer: 59 | Admitting: Rehabilitative and Restorative Service Providers"

## 2020-03-06 ENCOUNTER — Ambulatory Visit (INDEPENDENT_AMBULATORY_CARE_PROVIDER_SITE_OTHER): Payer: 59 | Admitting: Physical Therapy

## 2020-03-06 ENCOUNTER — Encounter: Payer: Self-pay | Admitting: Physical Therapy

## 2020-03-06 ENCOUNTER — Other Ambulatory Visit: Payer: Self-pay

## 2020-03-06 DIAGNOSIS — R600 Localized edema: Secondary | ICD-10-CM

## 2020-03-06 DIAGNOSIS — M25561 Pain in right knee: Secondary | ICD-10-CM

## 2020-03-06 DIAGNOSIS — R2689 Other abnormalities of gait and mobility: Secondary | ICD-10-CM

## 2020-03-06 DIAGNOSIS — M6281 Muscle weakness (generalized): Secondary | ICD-10-CM

## 2020-03-06 NOTE — Therapy (Signed)
[facility], [place], [address] Phone: [phone]   Fax:  [phone]  Physical Therapy Treatment and Renewal  Patient Details  Name: Jenny Baker MRN: [id number] Date of Birth: 1960/10/29 Referring Provider (PT): Jean Rosenthal, MD   Encounter Date: 03/06/2020   PT End of Session - 03/06/20 0809    Visit Number 10    Number of Visits 12    Date for PT Re-Evaluation 03/05/20    Authorization Type aetna    PT Start Time 0802    PT Stop Time 0850    PT Time Calculation (min) 48 min           Past Medical History:  Diagnosis Date  . Arthritis   . Cancer (HCC)    Basal cell on L side of nose  . Headache    Related to previous concussion   . Vertigo    Current episode 5-21    Past Surgical History:  Procedure Laterality Date  . APPENDECTOMY    . CESAREAN SECTION    . KNEE ARTHROSCOPY    . TOTAL KNEE ARTHROPLASTY Right 01/17/2020   Procedure: RIGHT TOTAL KNEE ARTHROPLASTY;  Surgeon: Mcarthur Rossetti, MD;  Location: [facility];  Service: Orthopedics;  Laterality: Right;    There were no vitals filed for this visit.   Subjective Assessment - 03/06/20 0810    Subjective Pt returns after being at beach for week. She feels she was progressing while at beach, but now that she has returned to work her Rt knee has had increased pain and tightness (up to 3/10).    Patient Stated Goals Walk normally, be able to hike without pain.    Currently in Pain? No/denies    Pain Score 0-No pain              [facility] PT Assessment - 03/06/20 0001      Assessment   Medical Diagnosis s/p R TKR 01/17/20    Referring Provider (PT) Jean Rosenthal, MD    Onset Date/Surgical Date 01/17/20      Observation/Other Assessments   Focus on Therapeutic Outcomes (FOTO)  32% limitation      AROM   Right/Left Knee Right;Left    Right Knee Extension -7    Right Knee Flexion 115    Left Knee Extension  0    Left Knee Flexion 149      Strength   Strength Assessment Site Knee;Hip    Right/Left Hip Right    Right Hip Flexion 5/5    Right Hip Extension 4+/5    Right Hip ABduction 5/5    Right Hip ADduction 4/5    Right/Left Knee Right    Right Knee Flexion 5/5    Right Knee Extension --   5-/5     Flexibility   Soft Tissue Assessment /Muscle Length yes    Quadriceps Rt knee 99 deg; Lt knee 144 deg              [facility] Adult PT Treatment/Exercise - 03/06/20 0001      Knee/Hip Exercises: Stretches   Passive Hamstring Stretch 5 reps;20 seconds   foot on 12" step   Quad Stretch Right;3 reps;30 seconds    Knee: Self-Stretch to increase Flexion Right;5 reps;20 seconds;3 reps;30 seconds    Knee: Self-Stretch Limitations forward lunge with foot on 12" step x 5, high kneeling forward lunge on mat table x 2    Gastroc Stretch Both;30 seconds;2  reps      Knee/Hip Exercises: Aerobic   Elliptical L2: 4 min       Knee/Hip Exercises: Machines for Strengthening   Cybex Knee Extension BLE into ext, RLE eccentric lowering x 10 with 2 plates.       Knee/Hip Exercises: Standing   Forward Step Up Limitations Rt step ups on to 10" step without UE support, x 10 reps, pausing for TKE once on step.       Knee/Hip Exercises: Supine   Heel Slides AAROM;Right    Heel Slides Limitations one rep for measurement      Vasopneumatic   Number Minutes Vasopneumatic  10 minutes    Vasopnuematic Location  Knee   Rt   Vasopneumatic Pressure Medium    Vasopneumatic Temperature  34 deg                       PT Long Term Goals - 03/06/20 1046      PT LONG TERM GOAL #1   Title The patient will be indep with HEP.    Time 6    Period Weeks    Status On-going      PT LONG TERM GOAL #2   Title The patient will reduce functional limitation per FOTO to < or equal to 19%.    Time 6    Period Weeks    Status On-going      PT LONG TERM GOAL #3   Title The patient will improve gait speed to >  3.0 ft/sec without a device independently.    Time 6    Period Weeks    Status Partially Met   not tested this date     PT LONG TERM GOAL #4   Title The patient will imrpove AROM R knee flexion from 76 degrees to > or equal to 105 degrees.    Time 6    Period Weeks    Status Achieved      PT LONG TERM GOAL #5   Title The patient will improve AROM R knee extension from -12 degrees to - 3 degrees.    Time 6    Period Weeks    Status Partially Met      PT LONG TERM GOAL #6   Title The patient will negotiate 4 steps without a handrail with reciprocal pattern indep    Time 6    Period Weeks    Status Achieved          UPDATED LONG TERM GOALS:     PT Long Term Goals - 03/06/20 1257      PT LONG TERM GOAL #1   Title The patient will be indep with HEP.    Time 6    Period Weeks    Status Revised    Target Date 04/17/20      PT LONG TERM GOAL #2   Title The patient will reduce functional limitation per FOTO to < or equal to 19%.    Baseline 60% limited    Time 6    Period Weeks    Status Revised    Target Date 04/05/20      PT LONG TERM GOAL #3   Title The patient will improve gait speed to > 3.0 ft/sec without a device independently.    Time 6    Period Weeks    Status Revised    Target Date 04/05/20      PT LONG TERM GOAL #4  Title The patient will improve AROM R knee extension from -12 degrees to - 3 degrees.    Time 6    Period Weeks    Status Revised    Target Date 04/05/20               Plan - 03/06/20 1047    Clinical Impression Statement Slight decrease in Rt knee ext ROM since last visit.  Rt knee/hip strength have improved.  Continued focus during session on lengthening tissue around knee/ ROM.   Pt may benefit from manual therapy/ DN in future visits.  Pt has partially met her goals and will benefit from continued PT intervention to maximize functional mobility.    Rehab Potential Good    PT Frequency 2x / week    PT Duration 6 weeks    PT  Treatment/Interventions ADLs/Self Care Home Management;Gait training;Stair training;Therapeutic activities;Therapeutic exercise;Neuromuscular re-education;Manual techniques;Taping;Patient/family education;Dry needling;Passive range of motion;Electrical Stimulation;Moist Heat;Cryotherapy;Vasopneumatic Device    PT Next Visit Plan LE strengthening progression, ROM,  gait training, vaso to reduce edema    PT Home Exercise Plan Access Code: [facility]    Consulted and Agree with Plan of Care Patient           Patient will benefit from skilled therapeutic intervention in order to improve the following deficits and impairments:  Abnormal gait, Decreased range of motion, Decreased strength, Pain, Increased edema, Decreased scar mobility, Impaired flexibility, Increased fascial restricitons  Visit Diagnosis: Acute pain of right knee  Muscle weakness (generalized)  Localized edema  Other abnormalities of gait and mobility     Problem List Patient Active Problem List   Diagnosis Date Noted  . Status post total right knee replacement 01/17/2020  . Unilateral primary osteoarthritis, right knee 11/18/2019  . Basal cell carcinoma of left cheek 02/08/2019  . Changing skin lesion 12/21/2018  . History of basal cell carcinoma (BCC) 12/21/2018  . Chronic pain of right knee 09/06/2017   Kerin Perna, PTA 03/06/20 10:49 AM  [facility] [facility] [place], [place], [address] Phone: [phone]   Fax:  [phone]  Name: Jenny Baker MRN: [id number] Date of Birth: 04/29/1961

## 2020-03-10 ENCOUNTER — Ambulatory Visit (INDEPENDENT_AMBULATORY_CARE_PROVIDER_SITE_OTHER): Payer: 59 | Admitting: Physical Therapy

## 2020-03-10 ENCOUNTER — Other Ambulatory Visit: Payer: Self-pay

## 2020-03-10 DIAGNOSIS — M25561 Pain in right knee: Secondary | ICD-10-CM | POA: Diagnosis not present

## 2020-03-10 DIAGNOSIS — R2689 Other abnormalities of gait and mobility: Secondary | ICD-10-CM | POA: Diagnosis not present

## 2020-03-10 DIAGNOSIS — M6281 Muscle weakness (generalized): Secondary | ICD-10-CM

## 2020-03-10 DIAGNOSIS — R600 Localized edema: Secondary | ICD-10-CM

## 2020-03-10 NOTE — Patient Instructions (Signed)

## 2020-03-10 NOTE — Therapy (Signed)
[facility] [place], [place], [address] Phone: [phone]   Fax:  [phone]  Physical Therapy Treatment  Patient Details  Name: Jenny Baker MRN: [id number] Date of Birth: 02-01-1961 Referring Provider (PT): Jean Rosenthal, MD   Encounter Date: 03/10/2020   PT End of Session - 03/10/20 0846    Visit Number 11    Number of Visits 16    Date for PT Re-Evaluation 04/17/20    Authorization Type aetna    PT Start Time 0800    PT Stop Time 0932    PT Time Calculation (min) 53 min    Activity Tolerance Patient tolerated treatment well;No increased pain    Behavior During Therapy WFL for tasks assessed/performed           Past Medical History:  Diagnosis Date  . Arthritis   . Cancer (HCC)    Basal cell on L side of nose  . Headache    Related to previous concussion   . Vertigo    Current episode 5-21    Past Surgical History:  Procedure Laterality Date  . APPENDECTOMY    . CESAREAN SECTION    . KNEE ARTHROSCOPY    . TOTAL KNEE ARTHROPLASTY Right 01/17/2020   Procedure: RIGHT TOTAL KNEE ARTHROPLASTY;  Surgeon: Mcarthur Rossetti, MD;  Location: [facility];  Service: Orthopedics;  Laterality: Right;    There were no vitals filed for this visit.   Subjective Assessment - 03/10/20 0804    Subjective Pt reports persistant tightness on Rt lateral side of knee; feels like this is what is preventing her from bending her knee more.  She has been working on active and passive ext outside of therapy sessions.  She complains of tightness with descending stairs.    Currently in Pain? No/denies    Pain Score 0-No pain              [facility] PT Assessment - 03/10/20 0001      Assessment   Medical Diagnosis s/p R TKR 01/17/20    Referring Provider (PT) Jean Rosenthal, MD    Onset Date/Surgical Date 01/17/20    Next MD Visit 3 months (06/02/20)      PROM   Right Knee Flexion 122       Flexibility   Quadriceps Rt knee 110 deg      Ambulation/Gait   Gait velocity 3.911 ft/sec            [facility] Adult PT Treatment/Exercise - 03/10/20 0001      Knee/Hip Exercises: Stretches   Passive Hamstring Stretch Right;3 reps;30 seconds   standing with foot on 12" step, overpressure from pt   Quad Stretch Right;3 reps;30 seconds   prone with noodle above knee   Hip Flexor Stretch Right;2 reps;30 seconds   Lt high kneeling, lunging forward   Knee: Self-Stretch Limitations forward lunge high kneeling forward lunge on mat table x 2 reps of 30 sec     Gastroc Stretch Both;30 seconds;2 reps    Other Knee/Hip Stretches quadruped position, attempted to sit back towards heels to increase Rt knee flexion x 30 sec x 3 reps      Knee/Hip Exercises: Aerobic   Elliptical L2: 4 min, L1: 1.75 min backwards      Other Aerobic single laps (80 ft around gym)in between exercises to reduce stiffness.       Knee/Hip Exercises: Standing   Step Down Left;1 set;Hand Hold: 1;Step Height: 8";10  reps      Knee/Hip Exercises: Seated   Sit to Sand 10 reps;without UE support   Rt foot slightly back     Vasopneumatic   Number Minutes Vasopneumatic  10 minutes    Vasopnuematic Location  Knee   Rt   Vasopneumatic Pressure Medium    Vasopneumatic Temperature  34 deg      Manual Therapy   Soft tissue mobilization IASTM to distal quad (medial and lateral) to decrease fascial restrictions to improve ROM.  STM to Rt quad and hamstring.     Passive ROM end range ext to Rt knee, 10-25 sec holds                   PT Education - 03/10/20 0907    Education Details DN info    Person(s) Educated Patient    Methods Explanation;Handout    Comprehension Verbalized understanding               PT Long Term Goals - 03/10/20 0906      PT LONG TERM GOAL #1   Title The patient will be indep with HEP.    Time 6    Period Weeks    Status On-going      PT LONG TERM GOAL #2   Title The patient will  reduce functional limitation per FOTO to < or equal to 19%.    Baseline 60% limited    Time 6    Period Weeks    Status On-going      PT LONG TERM GOAL #3   Title The patient will improve gait speed to > 3.0 ft/sec without a device independently.    Time 6    Period Weeks    Status Achieved      PT LONG TERM GOAL #4   Title The patient will improve AROM R knee extension from -12 degrees to - 3 degrees.    Time 6    Period Weeks    Status On-going                 Plan - 03/10/20 8315    Clinical Impression Statement Improvement in Rt knee PROM flexion and quad flexibility.  Improved tolerance for stair descending after IASTM and stretching.  Pt has met her gait speed goal this visit.  Progressing well towards remaining goals.    Rehab Potential Good    PT Frequency 2x / week    PT Duration 6 weeks    PT Treatment/Interventions ADLs/Self Care Home Management;Gait training;Stair training;Therapeutic activities;Therapeutic exercise;Neuromuscular re-education;Manual techniques;Taping;Patient/family education;Dry needling;Passive range of motion;Electrical Stimulation;Moist Heat;Cryotherapy;Vasopneumatic Device    PT Next Visit Plan LE strengthening progression, ROM,  gait training, vaso to reduce edema    PT Home Exercise Plan Access Code: [facility]    Consulted and Agree with Plan of Care Patient           Patient will benefit from skilled therapeutic intervention in order to improve the following deficits and impairments:  Abnormal gait, Decreased range of motion, Decreased strength, Pain, Increased edema, Decreased scar mobility, Impaired flexibility, Increased fascial restricitons  Visit Diagnosis: Acute pain of right knee  Muscle weakness (generalized)  Localized edema  Other abnormalities of gait and mobility     Problem List Patient Active Problem List   Diagnosis Date Noted  . Status post total right knee replacement 01/17/2020  . Unilateral primary  osteoarthritis, right knee 11/18/2019  . Basal cell carcinoma of left cheek 02/08/2019  .  Changing skin lesion 12/21/2018  . History of basal cell carcinoma (BCC) 12/21/2018  . Chronic pain of right knee 09/06/2017   Kerin Perna, PTA 03/10/20 9:24 AM  [place], [place], [address] Phone: [phone]   Fax:  [phone]  Name: Jenny Baker MRN: [id number] Date of Birth: [birth year]

## 2020-03-12 ENCOUNTER — Telehealth: Payer: Self-pay | Admitting: Orthopaedic Surgery

## 2020-03-12 ENCOUNTER — Other Ambulatory Visit: Payer: Self-pay

## 2020-03-12 DIAGNOSIS — Z96651 Presence of right artificial knee joint: Secondary | ICD-10-CM

## 2020-03-12 NOTE — Telephone Encounter (Signed)
Referral sent 

## 2020-03-12 NOTE — Telephone Encounter (Signed)
Patient called. She would like a referral for dry needle to be sent to [facility] where she is doing her PT

## 2020-03-13 ENCOUNTER — Encounter: Payer: 59 | Admitting: Physical Therapy

## 2020-03-17 ENCOUNTER — Ambulatory Visit (INDEPENDENT_AMBULATORY_CARE_PROVIDER_SITE_OTHER): Payer: 59 | Admitting: Rehabilitative and Restorative Service Providers"

## 2020-03-17 ENCOUNTER — Encounter: Payer: Self-pay | Admitting: Rehabilitative and Restorative Service Providers"

## 2020-03-17 ENCOUNTER — Other Ambulatory Visit: Payer: Self-pay

## 2020-03-17 DIAGNOSIS — M6281 Muscle weakness (generalized): Secondary | ICD-10-CM

## 2020-03-17 DIAGNOSIS — M25561 Pain in right knee: Secondary | ICD-10-CM

## 2020-03-17 DIAGNOSIS — R600 Localized edema: Secondary | ICD-10-CM | POA: Diagnosis not present

## 2020-03-17 DIAGNOSIS — R2689 Other abnormalities of gait and mobility: Secondary | ICD-10-CM

## 2020-03-17 NOTE — Therapy (Signed)
[place], [place], [address] Phone: [phone]   Fax:  [phone]  Physical Therapy Treatment  Patient Details  Name: Jenny Baker MRN: [id number] Date of Birth: 08-07-1960 Referring Provider (PT): Jean Rosenthal, MD   Encounter Date: 03/17/2020   PT End of Session - 03/17/20 0808    Visit Number 12    Number of Visits 16    Date for PT Re-Evaluation 04/17/20    Authorization Type aetna    PT Start Time 0801    PT Stop Time 3875    PT Time Calculation (min) 57 min    Activity Tolerance Patient tolerated treatment well;No increased pain           Past Medical History:  Diagnosis Date   Arthritis    Cancer ([place])    Basal cell on L side of nose   Headache    Related to previous concussion    Vertigo    Current episode 5-21    Past Surgical History:  Procedure Laterality Date   APPENDECTOMY     CESAREAN SECTION     KNEE ARTHROSCOPY     TOTAL KNEE ARTHROPLASTY Right 01/17/2020   Procedure: RIGHT TOTAL KNEE ARTHROPLASTY;  Surgeon: Mcarthur Rossetti, MD;  Location: [facility];  Service: Orthopedics;  Laterality: Right;    There were no vitals filed for this visit.   Subjective Assessment - 03/17/20 0808    Subjective Tired by the end of the work day. Still working on the bending and straightening.    Currently in Pain? No/denies    Pain Score 0-No pain                             [facility] Adult PT Treatment/Exercise - 03/17/20 0001      Therapeutic Activites    Therapeutic Activities --   added 1/4 inch heel lift Lt shoe to improve alignment      Knee/Hip Exercises: Aerobic   Recumbent Bike L4 x 6 min       Knee/Hip Exercises: Standing   Gait Training assessment of gait - and alignemnt in standing - slightly higher Rt hip with standing and observable with ambulation     Other Standing Knee Exercises captain morgan 5 reps x 3-5 sec hold        Moist Heat Therapy   Number Minutes Moist Heat 10 Minutes    Moist Heat Location Hip   Rt thigh/quads      Cryotherapy   Number Minutes Cryotherapy 10 Minutes    Cryotherapy Location Knee   Rt   Type of Cryotherapy Ice pack      Manual Therapy   Manual therapy comments skilled palpation to assess tissue response to DN/and manual work     Soft tissue mobilization deep tissue work through the medial and lateral quad to improve tissue extensibility     Kinesiotex --   taping to correct patellar alignment; improve mm balance      Kinesiotix   Inhibit Muscle  inhibit lateral quad     Facilitate Muscle  medical quad             Trigger Point Dry Needling - 03/17/20 0001    Consent Given? Yes    Education Handout Provided Yes    Electrical Stimulation Performed with Dry Needling Yes    E-stim with Dry Needling Details VMO frequency 2-12; intensity low; 5  min     Vastus lateralis Response Palpable increased muscle length;Twitch response elicited    Vastus medialis Response Palpable increased muscle length;Twitch response elicited                PT Education - 03/17/20 0832    Education Details DN HEP    Person(s) Educated Patient    Methods Explanation;Handout    Comprehension Verbalized understanding               PT Long Term Goals - 03/10/20 0906      PT LONG TERM GOAL #1   Title The patient will be indep with HEP.    Time 6    Period Weeks    Status On-going      PT LONG TERM GOAL #2   Title The patient will reduce functional limitation per FOTO to < or equal to 19%.    Baseline 60% limited    Time 6    Period Weeks    Status On-going      PT LONG TERM GOAL #3   Title The patient will improve gait speed to > 3.0 ft/sec without a device independently.    Time 6    Period Weeks    Status Achieved      PT LONG TERM GOAL #4   Title The patient will improve AROM R knee extension from -12 degrees to - 3 degrees.    Time 6    Period Weeks    Status  On-going                 Plan - 03/17/20 0834    Clinical Impression Statement Patient reports continued fatigue at the end of her work day. Good response to DN with recuritment of VMO and release of tightness through the lateral quad. Added exercises to recurit and strengthen VMO. Trial of 1/4 inch heel lift Lt shoe to improve alignment in standing and with walking.    Rehab Potential Good    PT Frequency 2x / week    PT Duration 6 weeks    PT Treatment/Interventions ADLs/Self Care Home Management;Gait training;Stair training;Therapeutic activities;Therapeutic exercise;Neuromuscular re-education;Manual techniques;Taping;Patient/family education;Dry needling;Passive range of motion;Electrical Stimulation;Moist Heat;Cryotherapy;Vasopneumatic Device    PT Next Visit Plan LE strengthening progression, ROM,  gait training, vaso to reduce edema; assess response to DN and heel lift    PT Home Exercise Plan Access Code: [place] and Agree with Plan of Care Patient           Patient will benefit from skilled therapeutic intervention in order to improve the following deficits and impairments:     Visit Diagnosis: Acute pain of right knee  Muscle weakness (generalized)  Localized edema  Other abnormalities of gait and mobility     Problem List Patient Active Problem List   Diagnosis Date Noted   Status post total right knee replacement 01/17/2020   Unilateral primary osteoarthritis, right knee 11/18/2019   Basal cell carcinoma of left cheek 02/08/2019   Changing skin lesion 12/21/2018   History of basal cell carcinoma (BCC) 12/21/2018   Chronic pain of right knee 09/06/2017    Mikel Pyon Nilda Simmer PT, MPH  03/17/2020, 11:51 AM  [facility] [place], [place], [address] Phone: [phone]   Fax:  [phone]  Name: Jenny Baker MRN: [id number] Date of Birth: [birth year]

## 2020-03-17 NOTE — Patient Instructions (Addendum)
Access Code: [email or web address]: [email or web address]: 08/24/2021Prepared by: Raelyn Racette HoltExercises  Quad Setting and Stretching - 2 x daily - 7 x weekly - 1 sets - 10 reps - 5 seconds hold  Active Straight Leg Raise with Quad Set - 2 x daily - 7 x weekly - 1 sets - 5-10 reps  Sidelying Hip Abduction - 2 x daily - 7 x weekly - 1 sets - 10 reps  Supine Knee Extension Strengthening - 2 x daily - 7 x weekly - 1 sets - 10 reps - 5-10 sec hold  Supine Knee Flexion AAROM at Wall - 2 x daily - 7 x weekly - 1 sets - 3 reps - 30-60 seconds hold  Seated Long Arc Quad - 2 x daily - 7 x weekly - 1 sets - 10 reps  Seated Hamstring Stretch - 2 x daily - 7 x weekly - 1 sets - 3 reps - 30 sec hold  Gastroc Stretch on Wall - 2 x daily - 7 x weekly - 1 sets - 3 reps - 20-30 seconds hold  Soleus Stretch on Wall - 2 x daily - 7 x weekly - 1 sets - 3 reps - 30 sec hold  Standing Terminal Knee Extension at Wall with Ball - 2 x daily - 7 x weekly - 1 sets - 10 reps - 5 sec hold  Glute Med Wall Lean - 2 x daily - 7 x weekly - 1-2 sets - 10 reps - 5 sec hold  Long Sitting Straight Leg Raise with External Rotation - 2 x daily - 7 x weekly - 1-2 sets - 10 reps - 3 sec hold   Trigger Point Dry Needling  . What is Trigger Point Dry Needling (DN)? o DN is a physical therapy technique used to treat muscle pain and dysfunction. Specifically, DN helps deactivate muscle trigger points (muscle knots).  o A thin filiform needle is used to penetrate the skin and stimulate the underlying trigger point. The goal is for a local twitch response (LTR) to occur and for the trigger point to relax. No medication of any kind is injected during the procedure.   . What Does Trigger Point Dry Needling Feel Like?  o The procedure feels different for each individual patient. Some patients report that they do not actually feel the needle enter the skin and overall the process is not painful. Very mild bleeding may occur. However,  many patients feel a deep cramping in the muscle in which the needle was inserted. This is the local twitch response.   Marland Kitchen How Will I feel after the treatment? o Soreness is normal, and the onset of soreness may not occur for a few hours. Typically this soreness does not last longer than two days.  o Bruising is uncommon, however; ice can be used to decrease any possible bruising.  o In rare cases feeling tired or nauseous after the treatment is normal. In addition, your symptoms may get worse before they get better, this period will typically not last longer than 24 hours.   . What Can I do After My Treatment? o Increase your hydration by drinking more water for the next 24 hours. o You may place ice or heat on the areas treated that have become sore, however, do not use heat on inflamed or bruised areas. Heat often brings more relief post needling. o You can continue your regular activities, but vigorous activity is not recommended initially after the treatment for 24 hours. o DN  is best combined with other physical therapy such as strengthening, stretching, and other therapies.

## 2020-03-24 ENCOUNTER — Other Ambulatory Visit: Payer: Self-pay

## 2020-03-24 ENCOUNTER — Encounter: Payer: Self-pay | Admitting: Rehabilitative and Restorative Service Providers"

## 2020-03-24 ENCOUNTER — Ambulatory Visit (INDEPENDENT_AMBULATORY_CARE_PROVIDER_SITE_OTHER): Payer: 59 | Admitting: Rehabilitative and Restorative Service Providers"

## 2020-03-24 DIAGNOSIS — M25561 Pain in right knee: Secondary | ICD-10-CM

## 2020-03-24 DIAGNOSIS — M6281 Muscle weakness (generalized): Secondary | ICD-10-CM

## 2020-03-24 DIAGNOSIS — R2689 Other abnormalities of gait and mobility: Secondary | ICD-10-CM | POA: Diagnosis not present

## 2020-03-24 DIAGNOSIS — R600 Localized edema: Secondary | ICD-10-CM | POA: Diagnosis not present

## 2020-03-24 NOTE — Therapy (Addendum)
[place], [place], [address] Phone: [phone]   Fax:  [phone]  Physical Therapy Treatment and Discharge Summary  Patient Details  Name: Jenny Baker MRN: [id number] Date of Birth: 1961/04/18 Referring Provider (PT): Jean Rosenthal, MD   Encounter Date: 03/24/2020   PT End of Session - 03/24/20 0834    Visit Number 13    Number of Visits 16    Date for PT Re-Evaluation 04/17/20    Authorization Type aetna    PT Start Time 0803    PT Stop Time 0843    PT Time Calculation (min) 40 min    Activity Tolerance Patient tolerated treatment well;No increased pain           Past Medical History:  Diagnosis Date  . Arthritis   . Cancer ([facility])    Basal cell on L side of nose  . Headache    Related to previous concussion   . Vertigo    Current episode 5-21    Past Surgical History:  Procedure Laterality Date  . APPENDECTOMY    . CESAREAN SECTION    . KNEE ARTHROSCOPY    . TOTAL KNEE ARTHROPLASTY Right 01/17/2020   Procedure: RIGHT TOTAL KNEE ARTHROPLASTY;  Surgeon: Mcarthur Rossetti, MD;  Location: [facility];  Service: Orthopedics;  Laterality: Right;    There were no vitals filed for this visit.   Subjective Assessment - 03/24/20 0807    Subjective The patient reports no pain in knee.  She gets some swelling and lateral knee discomfort at the end of a work shift.    Patient Stated Goals Walk normally, be able to hike without pain.    Currently in Pain? No/denies              [facility] PT Assessment - 03/24/20 0807      Assessment   Medical Diagnosis s/p R TKR 01/17/20    Referring Provider (PT) Jean Rosenthal, MD    Onset Date/Surgical Date 01/17/20      AROM   Right/Left Knee Right    Right Knee Extension -5    Right Knee Flexion 122                         [facility] Adult PT Treatment/Exercise - 03/24/20 1300      Exercises   Exercises Knee/Hip       Knee/Hip Exercises: Stretches   Active Hamstring Stretch Right;1 rep;30 seconds    Other Knee/Hip Stretches self mobilization R knee with a towel posterior knee while performing knee to chest-- this increases pull at anterior knee for end range stretch      Knee/Hip Exercises: Aerobic   Tread Mill 3% incline x 4 minutes up to 2.6 mph      Knee/Hip Exercises: Standing   Terminal Knee Extension Strengthening;Right;10 reps    Terminal Knee Extension Limitations into a ball wiht cues for quad isolation    Hip Abduction Stengthening;Right;Left;10 reps    Abduction Limitations X band walking with green theraband x 10 reps x 3 sets    Other Standing Knee Exercises standing hip abductor isometrics with R leg stance wiht knee straight and knee bent x 5 reps      Knee/Hip Exercises: Seated   Long Arc Quad Right;1 set;5 reps      Knee/Hip Exercises: Supine   Straight Leg Raise with External Rotation Strengthening;Right;10 reps      Knee/Hip  Exercises: Sidelying   Other Sidelying Knee/Hip Exercises Hip abduction with forward/backward kicks for glut med strengthening x 10 reps                       PT Long Term Goals - 03/24/20 0835      PT LONG TERM GOAL #1   Title The patient will be indep with HEP.    Time 6    Period Weeks    Status On-going      PT LONG TERM GOAL #2   Title The patient will reduce functional limitation per FOTO to < or equal to 19%.    Baseline 14% limited on 03/24/20    Time 6    Period Weeks    Status Achieved      PT LONG TERM GOAL #3   Title The patient will improve gait speed to > 3.0 ft/sec without a device independently.    Time 6    Period Weeks    Status Achieved      PT LONG TERM GOAL #4   Title The patient will improve AROM R knee extension from -12 degrees to - 3 degrees.    Baseline -4 degrees    Time 6    Period Weeks    Status Partially Met                 Plan - 03/24/20 1253    Clinical Impression Statement The  patient has continued to progress and is meeting [place].  She continues with -4 degrees knee extension and PT is continuing to work on terminal extension and patient's HEP continues to focus on knee extension + glut med strengthening.  Patient to work on HEP and return to clinic in 1-2 weeks for d/c visit (to assess new exercises and progress if needed).    Rehab Potential Good    PT Frequency 2x / week    PT Duration 6 weeks    PT Treatment/Interventions ADLs/Self Care Home Management;Gait training;Stair training;Therapeutic activities;Therapeutic exercise;Neuromuscular re-education;Manual techniques;Taping;Patient/family education;Dry needling;Passive range of motion;Electrical Stimulation;Moist Heat;Cryotherapy;Vasopneumatic Device    PT Next Visit Plan LE strengthening progression, ROM,  gait training, vaso to reduce edema; assess response to DN and heel lift    PT Home Exercise Plan Access Code: [place] and Agree with Plan of Care Patient           Patient will benefit from skilled therapeutic intervention in order to improve the following deficits and impairments:     Visit Diagnosis: Acute pain of right knee  Muscle weakness (generalized)  Localized edema  Other abnormalities of gait and mobility     Problem List Patient Active Problem List   Diagnosis Date Noted  . Status post total right knee replacement 01/17/2020  . Unilateral primary osteoarthritis, right knee 11/18/2019  . Basal cell carcinoma of left cheek 02/08/2019  . Changing skin lesion 12/21/2018  . History of basal cell carcinoma (BCC) 12/21/2018  . Chronic pain of right knee 09/06/2017  PHYSICAL THERAPY DISCHARGE SUMMARY  Visits from Start of Care: 13  Current functional level related to goals / functional outcomes: See goals above   Remaining deficits: -4 degrees extension ROM   Education / Equipment: HEP  Plan: Patient agrees to discharge.  Patient goals were partially met. Patient  is being discharged due to meeting the stated rehab goals.  ?????         Thank you for the referral  of this patient. Rudell Cobb, MPT  [place], [place] 03/24/2020, 1:07 PM  [facility] [place], [place], [address] Phone: [phone]   Fax:  [phone]  Name: Desmond Tufano MRN: [id number] Date of Birth: 1960-09-28

## 2020-03-24 NOTE — Patient Instructions (Signed)
Access Code: [facility] URL: [email or web address] Date: 03/24/2020 Prepared by: Rudell Cobb  Exercises Supine Knee Posterior Glide Self-Mobilization - 2 x daily - 7 x weekly - 1 sets - 2 reps - 30 seconds hold Long Sitting Straight Leg Raise with External Rotation - 2 x daily - 7 x weekly - 1-2 sets - 10 reps - 3 sec hold Seated Hamstring Stretch with Chair - 2 x daily - 7 x weekly - 1 sets - 10 reps Seated Long Arc Quad - 2 x daily - 7 x weekly - 1 sets - 10 reps Gastroc Stretch on Wall - 2 x daily - 7 x weekly - 1 sets - 3 reps - 20-30 seconds hold Soleus Stretch on Wall - 2 x daily - 7 x weekly - 1 sets - 3 reps - 30 sec hold Standing Terminal Knee Extension at Wall with Ball - 2 x daily - 7 x weekly - 1 sets - 10 reps - 5 sec hold Glute Med Wall Lean - 2 x daily - 7 x weekly - 2 sets - 10 reps - 5 sec hold X Band Walk - 2 x daily - 7 x weekly - 2 sets - 10 reps

## 2020-04-06 ENCOUNTER — Encounter: Payer: 59 | Admitting: Rehabilitative and Restorative Service Providers"

## 2020-04-28 ENCOUNTER — Telehealth: Payer: Self-pay | Admitting: Orthopaedic Surgery

## 2020-04-28 NOTE — Telephone Encounter (Signed)
Pt states she had a knee replacement and she's noticing her knee kind of slides and pops out when she stands; pt states it doesn't hurt but it feels like a popping and she wants to make sure everything is ok  [phone]

## 2020-04-29 NOTE — Telephone Encounter (Signed)
I need to see her sometime in the next 2 weeks to examine her knee

## 2020-04-29 NOTE — Telephone Encounter (Signed)
See below, can we make her an appt?

## 2020-04-29 NOTE — Telephone Encounter (Signed)
Appt made for 05/05/20 @ 8:45

## 2020-05-05 ENCOUNTER — Ambulatory Visit (INDEPENDENT_AMBULATORY_CARE_PROVIDER_SITE_OTHER): Payer: 59 | Admitting: Orthopaedic Surgery

## 2020-05-05 ENCOUNTER — Ambulatory Visit (INDEPENDENT_AMBULATORY_CARE_PROVIDER_SITE_OTHER): Payer: 59

## 2020-05-05 ENCOUNTER — Encounter: Payer: Self-pay | Admitting: Orthopaedic Surgery

## 2020-05-05 DIAGNOSIS — Z96651 Presence of right artificial knee joint: Secondary | ICD-10-CM | POA: Diagnosis not present

## 2020-05-05 NOTE — Progress Notes (Signed)
Office Visit Note   Patient: Jenny Baker           Date of Birth: 1960/11/15           MRN: [id number] Visit Date: 05/05/2020              Requested by: Lynder Parents., MD [place],  [place] [address] PCP: Lynder Parents., MD   Assessment & Plan: Visit Diagnoses:  1. Status post total right knee replacement     Plan: I would like her to try to avoid pivoting types of activities for now to work on quad strengthening as well as hamstring strengthening and calf strengthening.  Thus far, I do not feel that she needs a polyliner exchange, however, this may be something we recommend in the future if she still has that rotational instability.  I would like to reevaluate her clinically in 3 months but no x-rays are needed.  Follow-Up Instructions: Return in about 3 months (around 08/05/2020).   Orders:  Orders Placed This Encounter  Procedures  . XR Knee 1-2 Views Right   No orders of the defined types were placed in this encounter.     Procedures: No procedures performed   Clinical Data: No additional findings.   Subjective: Chief Complaint  Patient presents with  . Right Knee - Follow-up  The patient is a very active 59 year old female who is under 4 months out from a right total knee arthroplasty.  She has progressed much quicker than a majority of people to have knee replacements.  She is able to get her full motion back with minimal swelling and really no pain.  She has been doing gentle workouts and describes some instability of the knee replacement when she does some lateral sliding with a pivot and stretching.  She does still have numbness on the lateral aspect of her knee to be expected in the knee is somewhat stiff at the end of the day.  She only takes an occasional Aleve.  She denies any other acute changes in her medical status.  HPI  Review of Systems .  There is currently no listed headache, chest pain, shortness of breath, fever,  chills, nausea, vomiting  Objective: Vital Signs: There were no vitals taken for this visit.  Physical Exam She is alert and oriented x3 and in no acute distress Ortho Exam Examination of her right operative knee shows well-healed surgical incision.  There is minimal fluid on the knee.  Her range of motion shows full extension.  It does not hyperextend.  She has full flexion.  There is no instability with anterior and posterior drawer testing.  There is no instability with varus and valgus stressing.  There is slight opening when I externally rotate the knee with valgus stressing. Specialty Comments:  No specialty comments available.  Imaging: XR Knee 1-2 Views Right  Result Date: 05/05/2020 2 views of the right knee show well-seated total knee arthroplasty with good alignment and no complicating features.    PMFS History: Patient Active Problem List   Diagnosis Date Noted  . Status post total right knee replacement 01/17/2020  . Unilateral primary osteoarthritis, right knee 11/18/2019  . Basal cell carcinoma of left cheek 02/08/2019  . Changing skin lesion 12/21/2018  . History of basal cell carcinoma (BCC) 12/21/2018  . Chronic pain of right knee 09/06/2017   Past Medical History:  Diagnosis Date  . Arthritis   . Cancer ([place])  Basal cell on L side of nose  . Headache    Related to previous concussion   . Vertigo    Current episode 5-21    History reviewed. No pertinent family history.  Past Surgical History:  Procedure Laterality Date  . APPENDECTOMY    . CESAREAN SECTION    . KNEE ARTHROSCOPY    . TOTAL KNEE ARTHROPLASTY Right 01/17/2020   Procedure: RIGHT TOTAL KNEE ARTHROPLASTY;  Surgeon: Mcarthur Rossetti, MD;  Location: [facility];  Service: Orthopedics;  Laterality: Right;   Social History   Occupational History  . Not on file  Tobacco Use  . Smoking status: Never Smoker  . Smokeless tobacco: Never Used  Vaping Use  . Vaping Use: Never used    Substance and Sexual Activity  . Alcohol use: No  . Drug use: No  . Sexual activity: Not on file

## 2020-05-26 ENCOUNTER — Ambulatory Visit: Payer: 59 | Admitting: Orthopaedic Surgery

## 2020-06-02 ENCOUNTER — Ambulatory Visit: Payer: 59 | Admitting: Orthopaedic Surgery

## 2020-08-05 ENCOUNTER — Encounter: Payer: Self-pay | Admitting: Orthopaedic Surgery

## 2020-08-05 ENCOUNTER — Ambulatory Visit (INDEPENDENT_AMBULATORY_CARE_PROVIDER_SITE_OTHER): Payer: 59 | Admitting: Orthopaedic Surgery

## 2020-08-05 DIAGNOSIS — Z96651 Presence of right artificial knee joint: Secondary | ICD-10-CM | POA: Diagnosis not present

## 2020-08-05 NOTE — Progress Notes (Signed)
The patient is between 6 and 7 months status post a right total knee arthroplasty.  She still feels like she has some times where the knee feels like it is slipping but it does get tired after a busy day and her range of motion and strength are improving and she feels like she is getting there.  On examination the knee only has slight swelling and slight warmth.  Her range of motion of her right operative knee is full.  There is some slight play with varus and valgus stressing but not a lot with anterior and posterior stressing.  She will work on [employer].  At some point if she continues to have the symptoms of instability we may need to consider upsizing her polyliner and she understands that as well.  I would like to see her back in 5 months which will be the 1 year standpoint from surgery.  We will have AP and lateral of her right operative knee at that visit.  All questions and concerns were answered and addressed.

## 2021-01-04 ENCOUNTER — Encounter: Payer: Self-pay | Admitting: Orthopaedic Surgery

## 2021-01-04 ENCOUNTER — Ambulatory Visit: Payer: 59 | Admitting: Orthopaedic Surgery

## 2021-01-04 ENCOUNTER — Ambulatory Visit: Payer: Self-pay

## 2021-01-04 DIAGNOSIS — M25561 Pain in right knee: Secondary | ICD-10-CM

## 2021-01-04 DIAGNOSIS — Z96651 Presence of right artificial knee joint: Secondary | ICD-10-CM

## 2021-01-04 NOTE — Progress Notes (Signed)
Office Visit Note   Patient: Jenny Baker           Date of Birth: [birth year]           MRN: [id number] Visit Date: 01/04/2021              Requested by: Lynder Parents., MD [place],  [place] [id number] PCP: Lynder Parents., MD   Assessment & Plan: Visit Diagnoses:  1. History of total right knee replacement     Plan: I did review her operative note again.  I showed her knee model and explained in detail what is going on with her knee.  I do feel it is necessary at some point to upsize her polyliner with that knee to give her more stability given her mechanical symptoms.  I described what this involves surgically as well as described the interoperative postoperative course and what to expect.  I described the risk and benefits of the surgery.  She has her surgery scheduler's card and she will give [place] a call.  All questions and concerns were answered and addressed.  Follow-Up Instructions: No follow-ups on file.   Orders:  Orders Placed This Encounter  Procedures   XR Knee 1-2 Views Right   No orders of the defined types were placed in this encounter.     Procedures: No procedures performed   Clinical Data: No additional findings.   Subjective: Chief Complaint  Patient presents with   Right Knee - Follow-up  The patient is now almost 1 year status post a right total knee arthroplasty.  This was done with press-fit implants.  She still denies any swelling in the knee but does report mechanical instability type symptoms with coming downstairs and some pivoting activities with that knee.  She is very active and young 60 year old female.  She is had no acute change in medical status.  HPI  Review of Systems There is currently listed no headache, chest pain, shortness of breath, fever, chills, nausea, vomiting  Objective: Vital Signs: There were no vitals taken for this visit.  Physical Exam She is alert and orient x3 and in no acute  distress Ortho Exam Examination of her right operative knee shows no effusion.  She has excellent flexion and extension but there is slight playing varus and valgus stressing in that knee and with external rotation.  There is negative drawer sign. Specialty Comments:  No specialty comments available.  Imaging: XR Knee 1-2 Views Right  Result Date: 01/04/2021 2 views of the right knee show well-seated total knee arthroplasty with no evidence of loosening.  The alignment is anatomic.  There is no effusion.    PMFS History: Patient Active Problem List   Diagnosis Date Noted   Status post total right knee replacement 01/17/2020   Unilateral primary osteoarthritis, right knee 11/18/2019   Basal cell carcinoma of left cheek 02/08/2019   Changing skin lesion 12/21/2018   History of basal cell carcinoma (BCC) 12/21/2018   Chronic pain of right knee 09/06/2017   Past Medical History:  Diagnosis Date   Arthritis    Cancer ([place])    Basal cell on L side of nose   Headache    Related to previous concussion    Vertigo    Current episode 5-21    History reviewed. No pertinent family history.  Past Surgical History:  Procedure Laterality Date   APPENDECTOMY     CESAREAN SECTION     KNEE  ARTHROSCOPY     TOTAL KNEE ARTHROPLASTY Right 01/17/2020   Procedure: RIGHT TOTAL KNEE ARTHROPLASTY;  Surgeon: Mcarthur Rossetti, MD;  Location: [facility];  Service: Orthopedics;  Laterality: Right;   Social History   Occupational History   Not on file  Tobacco Use   Smoking status: Never   Smokeless tobacco: Never  Vaping Use   Vaping Use: Never used  Substance and Sexual Activity   Alcohol use: No   Drug use: No   Sexual activity: Not on file

## 2021-04-18 IMAGING — DX DG KNEE 1-2V PORT*R*
2 series · 2 of 2 positions shown · non-contrast
Comparison: 03/20/2018 right knee radiographs

CLINICAL DATA: Status post right total knee arthroplasty

EXAM:
PORTABLE RIGHT KNEE - 1-2 VIEW

[knee ap]
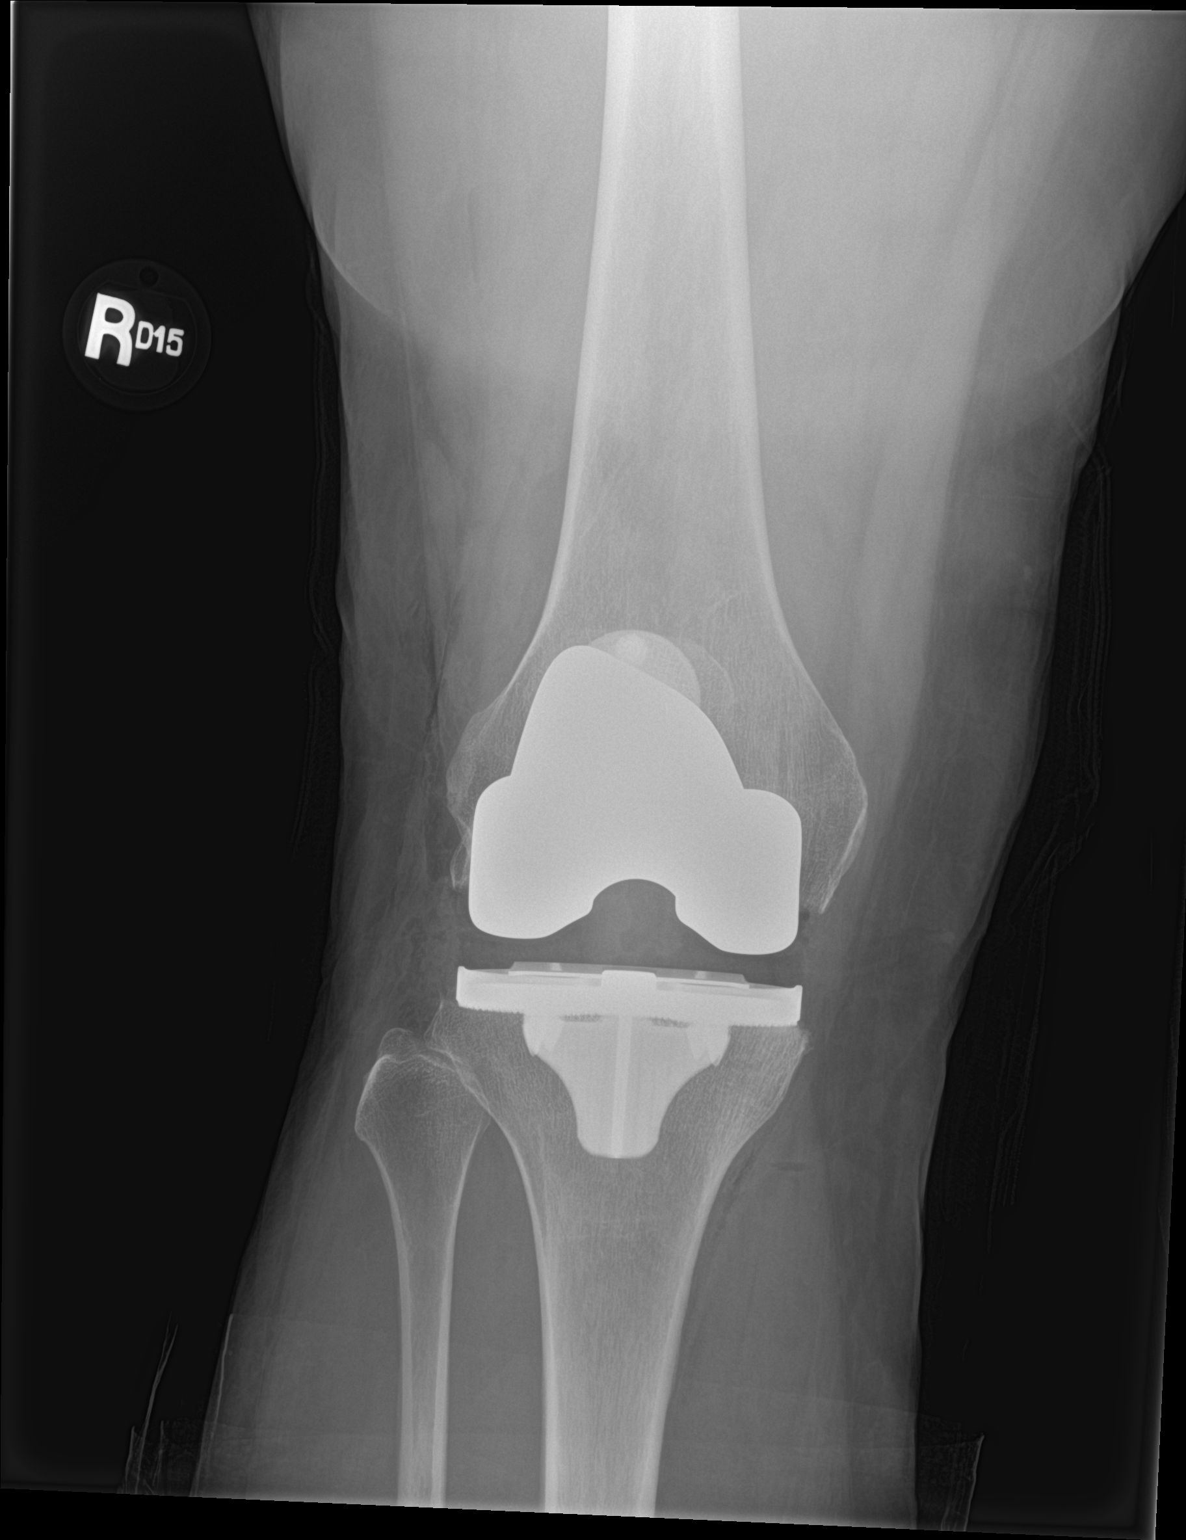

[knee lat]
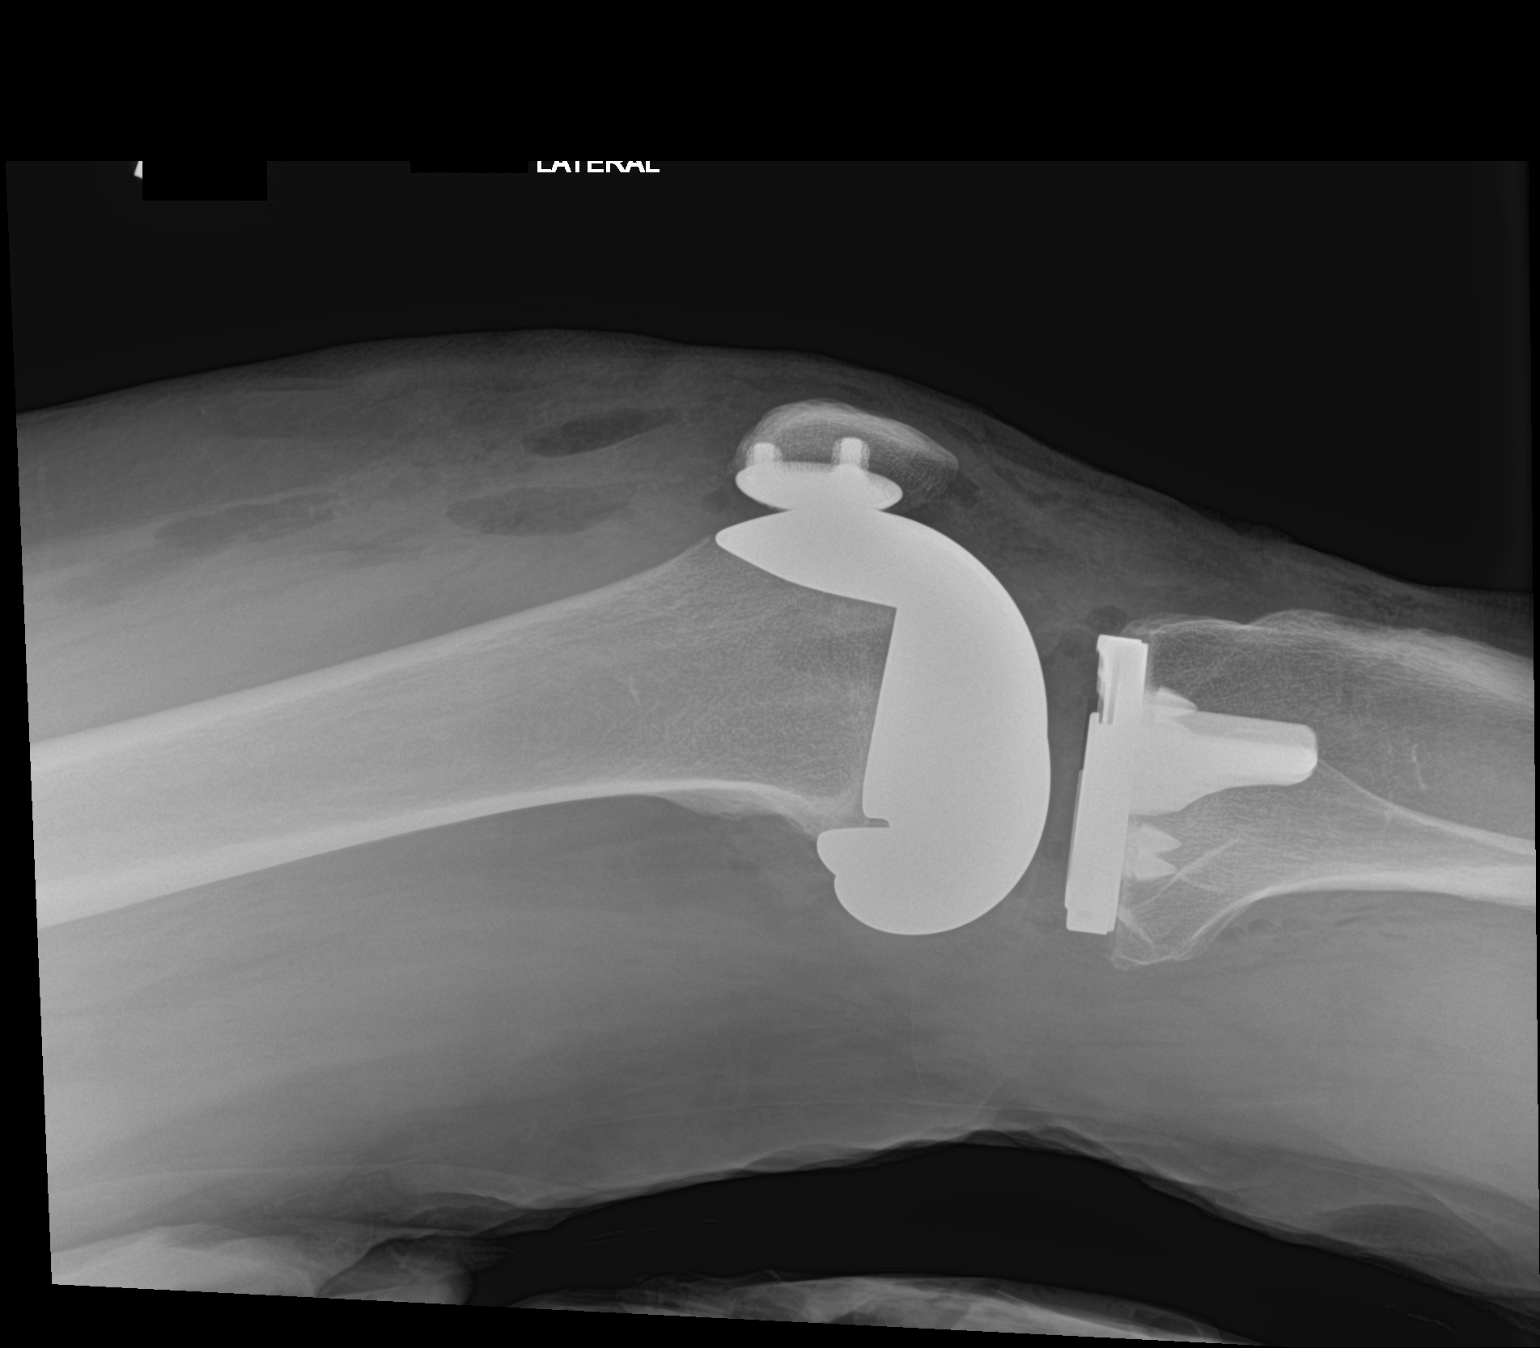

[2 of 2 positions shown; findings below may reference images not displayed]

FINDINGS: Status post right total knee arthroplasty with well-positioned
distal femoral, proximal tibial and posterior patellar prostheses.
No dislocation. No acute osseous fracture. No focal osseous lesions.
Expected gas within and surrounding the right knee joint.
IMPRESSION: Satisfactory immediate postoperative appearance status post right
total knee arthroplasty.
